# Patient Record
Sex: Male | Born: 1970 | Race: White | Hispanic: No | Marital: Single | Smoking: Current every day smoker
Health system: Southern US, Community
[De-identification: ages and names within clinical notes are randomized; demographics above are authoritative.]

## PROBLEM LIST (undated history)

## (undated) DIAGNOSIS — I10 Essential (primary) hypertension: Secondary | ICD-10-CM

## (undated) DIAGNOSIS — J45909 Unspecified asthma, uncomplicated: Secondary | ICD-10-CM

## (undated) HISTORY — PX: CHOLECYSTECTOMY: SHX55

---

## 2013-02-06 ENCOUNTER — Encounter (HOSPITAL_BASED_OUTPATIENT_CLINIC_OR_DEPARTMENT_OTHER): Payer: Self-pay | Admitting: Emergency Medicine

## 2013-02-06 ENCOUNTER — Emergency Department (HOSPITAL_BASED_OUTPATIENT_CLINIC_OR_DEPARTMENT_OTHER)
Admission: EM | Admit: 2013-02-06 | Discharge: 2013-02-06 | Disposition: A | Discharge status: Home / Self Care | Payer: Self-pay | Attending: Emergency Medicine | Admitting: Emergency Medicine

## 2013-02-06 ENCOUNTER — Emergency Department (HOSPITAL_BASED_OUTPATIENT_CLINIC_OR_DEPARTMENT_OTHER): Payer: Self-pay

## 2013-02-06 DIAGNOSIS — R630 Anorexia: Secondary | ICD-10-CM | POA: Insufficient documentation

## 2013-02-06 DIAGNOSIS — R109 Unspecified abdominal pain: Secondary | ICD-10-CM | POA: Insufficient documentation

## 2013-02-06 DIAGNOSIS — R590 Localized enlarged lymph nodes: Secondary | ICD-10-CM

## 2013-02-06 DIAGNOSIS — R634 Abnormal weight loss: Secondary | ICD-10-CM | POA: Insufficient documentation

## 2013-02-06 DIAGNOSIS — R599 Enlarged lymph nodes, unspecified: Secondary | ICD-10-CM | POA: Insufficient documentation

## 2013-02-06 DIAGNOSIS — R6883 Chills (without fever): Secondary | ICD-10-CM | POA: Insufficient documentation

## 2013-02-06 DIAGNOSIS — R1031 Right lower quadrant pain: Secondary | ICD-10-CM

## 2013-02-06 HISTORY — DX: Unspecified asthma, uncomplicated: J45.909

## 2013-02-06 HISTORY — DX: Essential (primary) hypertension: I10

## 2013-02-06 LAB — URINALYSIS, ROUTINE W REFLEX MICROSCOPIC
Nitrite: NEGATIVE
Specific Gravity, Urine: 1.018 (ref 1.005–1.030)
Urobilinogen, UA: 1 mg/dL (ref 0.0–1.0)
pH: 6.5 (ref 5.0–8.0)

## 2013-02-06 LAB — URINE MICROSCOPIC-ADD ON

## 2013-02-06 LAB — COMPREHENSIVE METABOLIC PANEL
ALT: 24 U/L (ref 0–53)
AST: 13 U/L (ref 0–37)
Albumin: 4.1 g/dL (ref 3.5–5.2)
Calcium: 9.6 mg/dL (ref 8.4–10.5)
Chloride: 105 mEq/L (ref 96–112)
Creatinine, Ser: 1 mg/dL (ref 0.50–1.35)
Potassium: 4.4 mEq/L (ref 3.5–5.1)
Sodium: 139 mEq/L (ref 135–145)
Total Bilirubin: 0.5 mg/dL (ref 0.3–1.2)

## 2013-02-06 LAB — CBC WITH DIFFERENTIAL/PLATELET
Basophils Absolute: 0 10*3/uL (ref 0.0–0.1)
HCT: 44.2 % (ref 39.0–52.0)
Hemoglobin: 15.5 g/dL (ref 13.0–17.0)
Lymphocytes Relative: 16 % (ref 12–46)
Lymphs Abs: 2 10*3/uL (ref 0.7–4.0)
MCH: 29.5 pg (ref 26.0–34.0)
MCHC: 35.1 g/dL (ref 30.0–36.0)
MCV: 84 fL (ref 78.0–100.0)
Neutro Abs: 8.8 10*3/uL — ABNORMAL HIGH (ref 1.7–7.7)
Platelets: 255 10*3/uL (ref 150–400)
RBC: 5.26 MIL/uL (ref 4.22–5.81)

## 2013-02-06 LAB — PROTIME-INR: INR: 1.08 (ref 0.00–1.49)

## 2013-02-06 LAB — APTT: aPTT: 32 seconds (ref 24–37)

## 2013-02-06 MED ORDER — ONDANSETRON HCL 4 MG/2ML IJ SOLN
4.0000 mg | Freq: Once | INTRAMUSCULAR | Status: AC
  Administered 2013-02-06: 4 mg via INTRAVENOUS
  Filled 2013-02-06: qty 2

## 2013-02-06 MED ORDER — HYDROMORPHONE HCL PF 1 MG/ML IJ SOLN
1.0000 mg | Freq: Once | INTRAMUSCULAR | Status: AC
  Administered 2013-02-06: 1 mg via INTRAVENOUS
  Filled 2013-02-06: qty 1

## 2013-02-06 MED ORDER — IOHEXOL 300 MG/ML  SOLN
50.0000 mL | Freq: Once | INTRAMUSCULAR | Status: AC | PRN
  Administered 2013-02-06: 50 mL via ORAL

## 2013-02-06 MED ORDER — SODIUM CHLORIDE 0.9 % IV BOLUS (SEPSIS)
1000.0000 mL | Freq: Once | INTRAVENOUS | Status: AC
  Administered 2013-02-06: 1000 mL via INTRAVENOUS

## 2013-02-06 MED ORDER — CEPHALEXIN 500 MG PO CAPS: 500.0000 mg | ORAL_CAPSULE | Freq: Four times a day (QID) | ORAL | Status: DC

## 2013-02-06 MED ORDER — HYDROCODONE-ACETAMINOPHEN 5-325 MG PO TABS: 2.0000 | ORAL_TABLET | ORAL | Status: DC | PRN

## 2013-02-06 MED ORDER — IOHEXOL 300 MG/ML  SOLN
100.0000 mL | Freq: Once | INTRAMUSCULAR | Status: AC | PRN
  Administered 2013-02-06: 100 mL via INTRAVENOUS

## 2013-02-06 NOTE — ED Provider Notes (Signed)
Patient is a 42 year old otherwise healthy male presents with complaints of swelling and tenderness in the right groin. He was seen initially by Dr. Bernette Mayers and had laboratory studies performed. This revealed an unremarkable urinalysis and white count was mildly elevated at 12.3. For that reason a CT scan was obtained and care was signed out to me at shift change. Results of the CT scan reveal an enlarged lymph node in the right inguinal region, however there is no evidence for intra-abdominal pathology, specifically there is no evidence for appendicitis. He seems to be feeling better and when reexamined his abdomen is benign. I explained the results of the studies to him and we'll treat the enlarged lymph node with Keflex and pain medication. He was advised to observe this for the next 2-3 weeks and if not improving or if it worsens this needs to be rechecked. He will be discharged to home.  Lucas Lyons, MD 02/06/13 [phone]

## 2013-02-06 NOTE — ED Notes (Signed)
R flank pain since around 3 am yesterday, intermittent, hurts more when ambulating or moving

## 2013-02-06 NOTE — ED Provider Notes (Signed)
CSN: [id number]     Arrival date & time 02/06/13  1245 History   First MD Initiated Contact with Patient 02/06/13 1307     Chief Complaint  Patient presents with  . Flank Pain   (Consider location/radiation/quality/duration/timing/severity/associated sxs/prior Treatment) Patient is a 42 y.o. male presenting with flank pain.  Flank Pain   Pt with no significant PMH reports swollen tender knot in R groin for the last week or so, now with worsening pain radiating into R flank, worse with movement, associated with urinary hesitancy, anorexia, weight loss and chills. He denies any testicular pain or swelling, no penile discharge.   No past medical history on file. No past surgical history on file. No family history on file. History  Substance Use Topics  . Smoking status: Not on file  . Smokeless tobacco: Not on file  . Alcohol Use: Not on file    Review of Systems  Genitourinary: Positive for flank pain.   All other systems reviewed and are negative except as noted in HPI.   Allergies  Review of patient's allergies indicates not on file.  Home Medications  No current outpatient prescriptions on file. BP 142/96  Pulse 107  Temp(Src) 98.2 F (36.8 C) (Oral)  Resp 18  Ht 5\' 9"  (1.753 m)  Wt 155 lb (70.308 kg)  BMI 22.88 kg/m2  SpO2 98% Physical Exam  Nursing note and vitals reviewed. Constitutional: He is oriented to person, place, and time. He appears well-developed and well-nourished.  HENT:  Head: Normocephalic and atraumatic.  Eyes: EOM are normal. Pupils are equal, round, and reactive to light.  Neck: Normal range of motion. Neck supple.  Cardiovascular: Normal rate, normal heart sounds and intact distal pulses.   Pulmonary/Chest: Effort normal and breath sounds normal.  Abdominal: Bowel sounds are normal. He exhibits no distension. There is no tenderness.  Genitourinary: Testes normal and penis normal.  Musculoskeletal: Normal range of motion. He exhibits  tenderness (R lower back and R flank tender). He exhibits no edema.  Lymphadenopathy:       Right: Inguinal (large tender lymph node in R groin) adenopathy present.       Left: Inguinal adenopathy present.  Neurological: He is alert and oriented to person, place, and time. He has normal strength. No cranial nerve deficit or sensory deficit.  Skin: Skin is warm and dry. No rash noted.  Psychiatric: He has a normal mood and affect.    ED Course  Procedures (including critical care time) Labs Review Labs Reviewed  CBC WITH DIFFERENTIAL  URINALYSIS, ROUTINE W REFLEX MICROSCOPIC  COMPREHENSIVE METABOLIC PANEL  PROTIME-INR  APTT   Imaging Review No results found.  EKG Interpretation   None       MDM  No diagnosis found. Care signed out to Dr. Judd Lien at shift change pending CT.    Charles B. Bernette Mayers, MD 02/06/13 2036

## 2013-07-18 ENCOUNTER — Emergency Department (HOSPITAL_BASED_OUTPATIENT_CLINIC_OR_DEPARTMENT_OTHER)
Admission: EM | Admit: 2013-07-18 | Discharge: 2013-07-18 | Disposition: A | Discharge status: Home / Self Care | Payer: Self-pay | Attending: Emergency Medicine | Admitting: Emergency Medicine

## 2013-07-18 ENCOUNTER — Encounter (HOSPITAL_BASED_OUTPATIENT_CLINIC_OR_DEPARTMENT_OTHER): Payer: Self-pay | Admitting: Emergency Medicine

## 2013-07-18 DIAGNOSIS — J45909 Unspecified asthma, uncomplicated: Secondary | ICD-10-CM | POA: Insufficient documentation

## 2013-07-18 DIAGNOSIS — I10 Essential (primary) hypertension: Secondary | ICD-10-CM | POA: Insufficient documentation

## 2013-07-18 DIAGNOSIS — K0889 Other specified disorders of teeth and supporting structures: Secondary | ICD-10-CM

## 2013-07-18 DIAGNOSIS — K089 Disorder of teeth and supporting structures, unspecified: Secondary | ICD-10-CM | POA: Insufficient documentation

## 2013-07-18 DIAGNOSIS — F17200 Nicotine dependence, unspecified, uncomplicated: Secondary | ICD-10-CM | POA: Insufficient documentation

## 2013-07-18 MED ORDER — TRAMADOL HCL 50 MG PO TABS: 50.0000 mg | ORAL_TABLET | Freq: Four times a day (QID) | ORAL | Status: DC | PRN

## 2013-07-18 MED ORDER — TRAMADOL HCL 50 MG PO TABS
50.0000 mg | ORAL_TABLET | Freq: Once | ORAL | Status: AC
  Administered 2013-07-18: 50 mg via ORAL
  Filled 2013-07-18: qty 1

## 2013-07-18 MED ORDER — PENICILLIN V POTASSIUM 500 MG PO TABS: 500.0000 mg | ORAL_TABLET | Freq: Four times a day (QID) | ORAL | Status: AC

## 2013-07-18 MED ORDER — IBUPROFEN 800 MG PO TABS
800.0000 mg | ORAL_TABLET | Freq: Once | ORAL | Status: AC
  Administered 2013-07-18: 800 mg via ORAL
  Filled 2013-07-18: qty 1

## 2013-07-18 NOTE — ED Notes (Signed)
Patient reports that he has right lower gum and jaw pain pain since Thursday, thinks food stuck in same and had swelling but took family members amoxicillin with some reduction in swelling. Also complains of left ear drainage and pain

## 2013-07-18 NOTE — ED Provider Notes (Signed)
CSN: [id number]     Arrival date & time 07/18/13  1345 History   First MD Initiated Contact with Patient 07/18/13 1442     Chief Complaint  Patient presents with  . Dental Pain     (Consider location/radiation/quality/duration/timing/severity/associated sxs/prior Treatment) HPI Comments: 43 yo male with poor dentition hx, smoker presents with right lower dental pain since Thursday after chewing. Pain with palpation, constant.  No fevers. No swelling of face. Pt cannot afford dentist right now.  Tried left over amox, mild improvement.  Patient is a 43 y.o. male presenting with tooth pain. The history is provided by the patient.  Dental Pain Associated symptoms: no drooling and no fever     Past Medical History  Diagnosis Date  . Hypertension   . Asthma    Past Surgical History  Procedure Laterality Date  . Cholecystectomy     No family history on file. History  Substance Use Topics  . Smoking status: Current Every Day Smoker -- 1.00 packs/day    Types: Cigarettes  . Smokeless tobacco: Not on file  . Alcohol Use: Yes     Comment: special occasions    Review of Systems  Constitutional: Negative for fever, chills and appetite change.  HENT: Positive for dental problem. Negative for drooling.       Allergies  Codeine  Home Medications   Current Outpatient Rx  Name  Route  Sig  Dispense  Refill  . penicillin v potassium (VEETID) 500 MG tablet   Oral   Take 1 tablet (500 mg total) by mouth 4 (four) times daily.   28 tablet   0   . traMADol (ULTRAM) 50 MG tablet   Oral   Take 1 tablet (50 mg total) by mouth every 6 (six) hours as needed.   10 tablet   0    BP 137/93  Pulse 76  Temp(Src) 97.7 F (36.5 C) (Oral)  Resp 16  Ht 5\' 9"  (1.753 m)  Wt 160 lb (72.576 kg)  BMI 23.62 kg/m2  SpO2 98% Physical Exam  Nursing note and vitals reviewed. Constitutional: He appears well-developed and well-nourished. No distress.  HENT:  Head: Normocephalic and  atraumatic.  Mild right gingival tenderness right lower, poor dentition, multiple missing teeth No trismus, uvular deviation, unilateral posterior pharyngeal edema or submandibular swelling.   Neck: Normal range of motion. Neck supple.  Cardiovascular: Normal rate.   Pulmonary/Chest: Effort normal.    ED Course  Procedures (including critical care time) Labs Review Labs Reviewed - No data to display Imaging Review No results found.   EKG Interpretation None      MDM   Final diagnoses:  Pain, dental    Dental pain. Smoking cessation and outpatient follow up was discussed with the patient for 3 to 5  Minutes.  Pain meds in ED. Results and differential diagnosis were discussed with the patient. Close follow up outpatient was discussed, patient comfortable with the plan.   Filed Vitals:   07/18/13 1351  BP: 137/93  Pulse: 76  Temp: 97.7 F (36.5 C)  TempSrc: Oral  Resp: 16  Height: 5\' 9"  (1.753 m)  Weight: 160 lb (72.576 kg)  SpO2: 98%     Enid SkeensJoshua M Jaycie Kregel, MD 07/18/13 [phone]

## 2013-07-18 NOTE — Discharge Instructions (Signed)
If you were given medicines take as directed.  If you are on coumadin or contraceptives realize their levels and effectiveness is altered by many different medicines.  If you have any reaction (rash, tongues swelling, other) to the medicines stop taking and see a physician.   Please follow up as directed and return to the ER or see a physician for new or worsening symptoms.  Thank you. Filed Vitals:   07/18/13 1351  BP: 137/93  Pulse: 76  Temp: 97.7 F (36.5 C)  TempSrc: Oral  Resp: 16  Height: 5\' 9"  (1.753 m)  Weight: 160 lb (72.576 kg)  SpO2: 98%    Emergency Department Resource Guide 1) Find a Doctor and Pay Out of Pocket Although you won't have to find out who is covered by your insurance plan, it is a good idea to ask around and get recommendations. You will then need to call the office and see if the doctor you have chosen will accept you as a new patient and what types of options they offer for patients who are self-pay. Some doctors offer discounts or will set up payment plans for their patients who do not have insurance, but you will need to ask so you aren't surprised when you get to your appointment.  2) Contact Your Local Health Department Not all health departments have doctors that can see patients for sick visits, but many do, so it is worth a call to see if yours does. If you don't know where your local health department is, you can check in your phone book. The CDC also has a tool to help you locate your state's health department, and many state websites also have listings of all of their local health departments.  3) Find a Walk-in Clinic If your illness is not likely to be very severe or complicated, you may want to try a walk in clinic. These are popping up all over the country in pharmacies, drugstores, and shopping centers. They're usually staffed by nurse practitioners or physician assistants that have been trained to treat common illnesses and complaints. They're  usually fairly quick and inexpensive. However, if you have serious medical issues or chronic medical problems, these are probably not your best option.  No Primary Care Doctor: - Call Health Connect at  [phone] - they can help you locate a primary care doctor that  accepts your insurance, provides certain services, etc. - Physician Referral Service- [phone]  Chronic Pain Problems: Organization         Address  Phone   Notes  [facility]  [phone] Patients need to be referred by their primary care doctor.   Medication Assistance: Organization         Address  Phone   Notes  [facility] Medication [facility] [address] [place] [place], [place] [address] [phone] --Must be a resident of [facility] -- Must have NO insurance coverage whatsoever (no Medicaid/ Medicare, etc.) -- The pt. MUST have a primary care doctor that directs their care regularly and follows them in the community   [facility]  [phone]   [employer]  [phone]    Agencies that provide inexpensive medical care: Organization         Address  Phone   Notes  [facility]  [phone]   [facility]    [phone]   [facility] [address] [place], [place] [address] (  336) [phone]   [facility] [place] [address], [place] [phone](336) [phone]   [facility]anned [facility]    [phone]   [facility]    [phone](336) [phone]   [facility]  [address], [place] Phone:  [phone], Fax:  [phone] Hours of Operation:  9 am - 6 pm, M-F.  Also accepts Medicaid/Medicare and self-pay.  [facility]  [address], [place] Phone: [phone](336) [phone], Fax: [phone]. Hours of Operation:  8:30 am - 5:30 pm, M-F.  Also accepts Medicaid and self-pay.  [facility] [place] [address], [place] Phone:  [phone]   [facility] [address], [place], [place] [phone], Ext. [phone] Mondays & Thursdays: 7-9 AM.  First 15 patients are seen on a first come, first serve basis.    Medicaid-accepting [facility] Providers:  Organization         Address  Phone   Notes  [facility] [address], [place] [phone](336) [phone] Also accepts self-pay patients.  [facility] [address], [place]  [phone]   [facility] [address], [place] [phone]   [facility] [address] [place] [address], [place] [phone](336) [phone]   Renaye RakersVeita Bland [address], [place]   [phone] Only accepts [place] Access [place] patients after they have their name applied to their card.   Self-Pay (no insurance) in [facility]:  Organization         Address  Phone   Notes  Sickle Cell Patients, [facility] [address], [place] [phone]) [phone]   [facility] [address], [place] [phone]) [phone]   [facility]  [address], [place] [phone](336) [phone]   [facility]/Dr. Osei-Bonsu  [address] [place] [address], [place] or [phone], [place] [phone] Phone number for both [place] and [place] locations is the same.  [facility] [address], [place] [phone]   [facility] [place] [address] [place] [address], [place] or [address] [phone](336) [phone]   [facility] [address], [place] [phone]) [phone], phone; [phone](336) [phone], fax Sees patients 1st and 3rd Saturday of every month.  Must not qualify for public or private insurance (i.e. Medicaid, Medicare, [place] Health Choice, Veterans' Benefits)  Household income should be no more than 200% of the poverty level The clinic cannot treat  you if you are pregnant or think you are pregnant  Sexually transmitted diseases are not treated at the clinic.    Dental Care: Organization         Address  Phone  Notes  [facility] [address], [place] [phone](336) [phone] Accepts children up to age 44 who are enrolled in [place] or [place] Health Choice; pregnant women with a Medicaid card; and children who have applied for Medicaid or [place] Health Choice, but were declined, whose parents can pay a reduced fee at time of service.  [facility] of [facility] [place]  [address], [place] [phone](336) [phone] Accepts children up to age 44 who are enrolled in [place] or [place] Health Choice; pregnant women with  a Medicaid card; and children who have applied for Medicaid or [place] Health Choice, but were declined, whose parents can pay a reduced fee at time of service.  [facility]  [address] [place], [place] [phone] Patients are seen by appointment only. Walk-ins are not accepted. [facility] will see patients 23 years of age and older. Monday - Tuesday (8am-5pm) Most Wednesdays (8:30-5pm) $30 per visit, cash only  [facility] Access [facility]  [address], [facility] [phone] Patients are seen by appointment only. Walk-ins are not accepted. [facility] will see patients 68 years of age and older. One Wednesday Evening (Monthly: Volunteer Based).  $30 per visit, cash only  [employer]  [phone] for adults; Children under age 36, call Graduate Pediatric Dentistry at [phone]. Children aged 39-14, please call [phone] to request a pediatric application.  Dental services are provided in all areas of dental care including fillings, crowns and bridges, complete and partial dentures, implants, gum treatment, root canals, and extractions. Preventive care is also provided. Treatment  is provided to both adults and children. Patients are selected via a lottery and there is often a waiting list.   [facility] [address], [place]  [phone] [email or web address]   [facility] [address] [place], [place] [phone], Ext. [phone] Second and Fourth Thursday of each month, opens at 6:30 AM; Clinic ends at 9 AM.  Patients are seen on a first-come first-served basis, and a limited number are seen during each clinic.   [facility]  [address] [place], [place] [phone]   Eligibility Requirements You must have lived in [place], [place], or [place] counties for at least the last three months.   You cannot be eligible for state or federal sponsored [place], including [employer], [place], or [employer].   You generally cannot be eligible for healthcare insurance through your employer.    How to apply: Eligibility screenings are held every Tuesday and Wednesday afternoon from 1:00 pm until 4:00 pm. You do not need an appointment for the interview!  [facility] [address] [place], [place] [phone]   [facility] Health Department  [phone]   [facility] Health Department  [phone]   [facility] Health Department  [phone]    Behavioral Health Resources in the Community: Intensive Outpatient Programs Organization         Address  Phone  Notes  [facility] [address] [place], [place] [phone]   [facility] [address], [place], [place] [phone]   [facility] [address], [place], [place]  [phone]   [facility] [address],  [place], [place] [phone] or [phone]   Substance Abuse Resources Organization         Address  Phone  Notes  Al[facility] Drug Services  [phone]   [facility]  [phone]   The  [place]  [phone]   Floydene Flock  [phone]   Residential & Outpatient Substance Abuse Program  [phone]   Psychological Services Organization         Address  Phone  Notes  [facility] Behavioral Health  [phone]   [facility]  [phone]   [facility] [address] [place] [phone] or [phone]    Mobile Crisis Teams Organization  Address  Phone  Notes  [facility], Mobile Crisis Care Unit  [phone]   [facility]  [address] [place], [place]   [facility] [address] [place] [phone]    Self-Help/Support Groups Organization         Address  Phone             Notes  [place] [facility] [place] - variety of support groups  [place] Call for more information  Narcotics Anonymous (NA), [facility] [address], [employer] [place]  2 meetings at this location   Special educational needs teacher         Address  Phone  Notes  [facility] [place],    [place]  [phone]   [facility]  [address] [place] 329924, [place], [place]   [place], [place] [phone] Admissions: 8am-3pm M-F  Incentives Substance [place] [address], [place] [phone]   [facility] [address] [place], [place], [place]   [facility] [address],  [place], [place]   [facility] - Intensive Outpatient [address], [place], [place]   [facility] [place].,  [place], [place] [phone] or [phone]   [facility] (RT[facility]) [address], [place], [place] Accepts Medicaid  [facility] [place] [address]  [place] [phone] Substance Abuse/Addiction Treatment     [facility] Organization         Address  Phone  Notes  [facility]  [phone]   Domenic Schwab, PhD [address] [place], [place]   [phone] or [phone]   [place], [place] [phone]   [facility] [address] [place], [place] [phone] Insurance/Medicaid/sponsorship through [facility] and Families [address], [address]                                    [place], [place] [phone] [place] [address], [place] [phone]    Dr. Adele Schilder  [phone]   [facility] Dept. 1) [address], [place] 2) [place] 3)  [place] [address], [place] [phone]  [phone]   [place] [phone] or [phone] (After Hours)

## 2014-09-15 IMAGING — CT CT ABD-PELV W/ CM
2 of 5 series · 16 of 46 positions shown, 18 images · IV contrast (APPLIED)
Comparison: No priors.

CLINICAL DATA: Right-sided flank and right lower quadrant abdominal
pain. Right inguinal lymphadenopathy. Weight loss.

EXAM:
CT ABDOMEN AND PELVIS WITH CONTRAST
TECHNIQUE: Multidetector CT imaging of the abdomen and pelvis was performed
using the standard protocol following bolus administration of
intravenous contrast.
CONTRAST:  50mL OMNIPAQUE IOHEXOL 300 MG/ML SOLN, 100mL OMNIPAQUE
IOHEXOL 300 MG/ML SOLN

[Series 2: abd/pelvis 5.0 b31f · axial · 0.70mm/px · z∈[+856,+1251]mm · 13 of 89 slices shown, 15 images]
[im 5/89  soft-tissue]
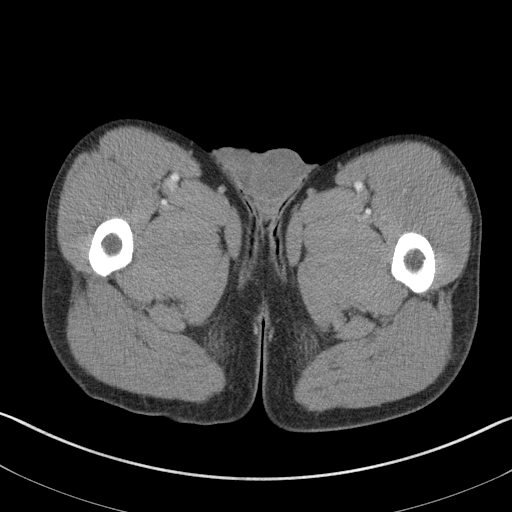
[im 5/89  bone]
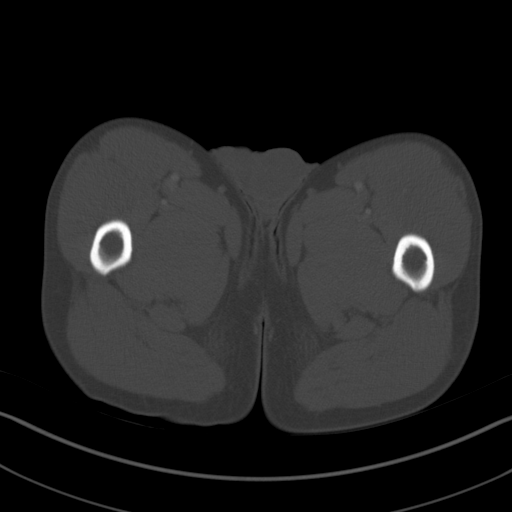
[im 14/89  soft-tissue]
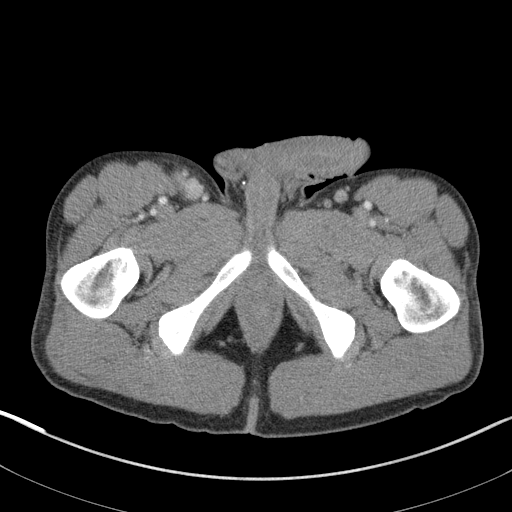
[im 18/89  soft-tissue]
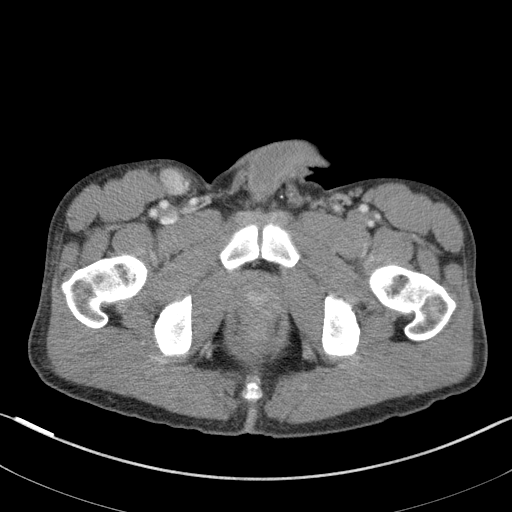
[im 27/89  soft-tissue]
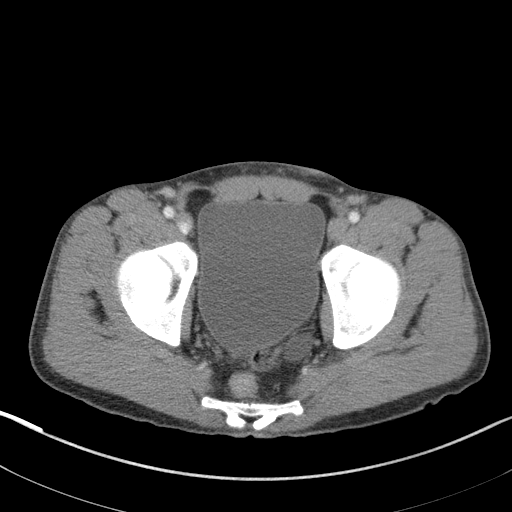
[im 31/89  soft-tissue]
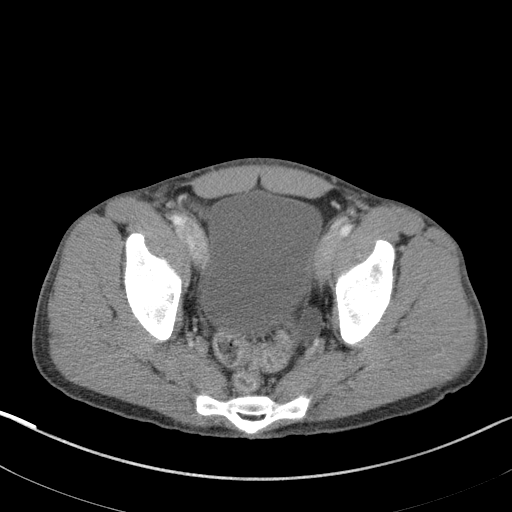
[im 40/89  soft-tissue]
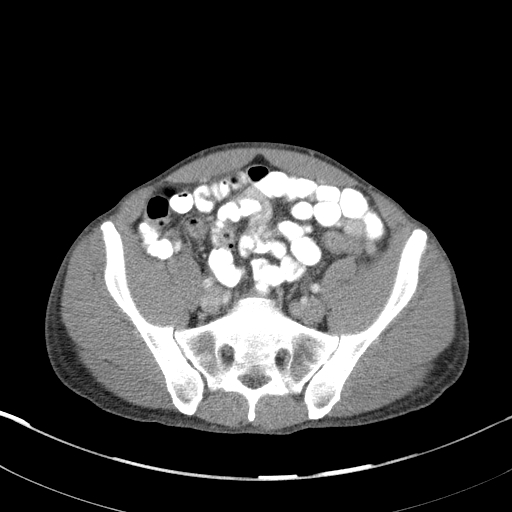
[im 45/89  soft-tissue]
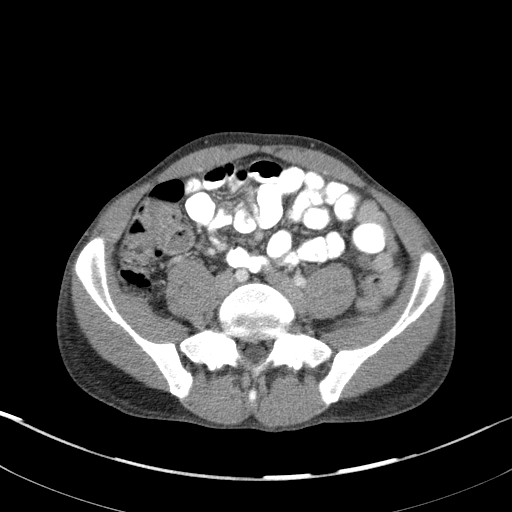
[im 49/89  soft-tissue]
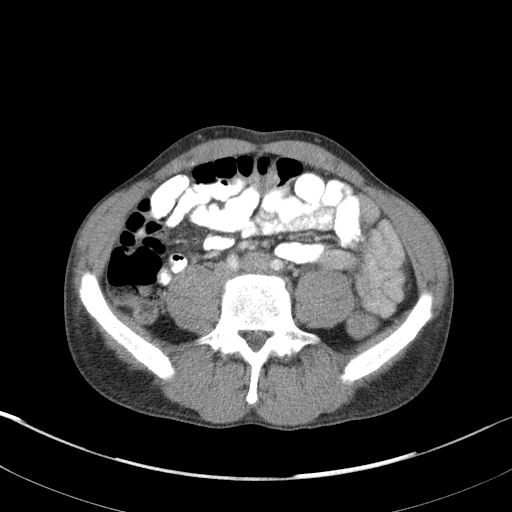
[im 58/89  soft-tissue]
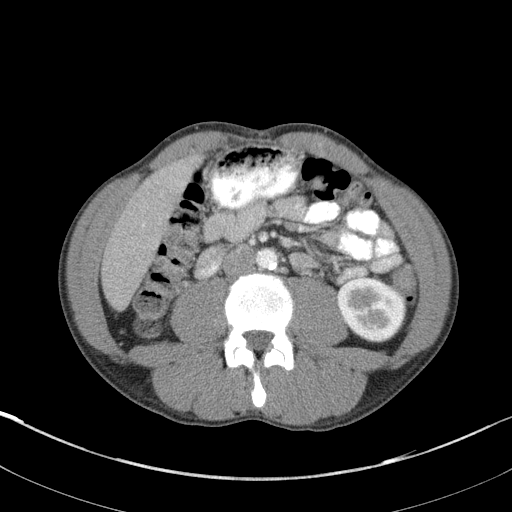
[im 58/89  bone]
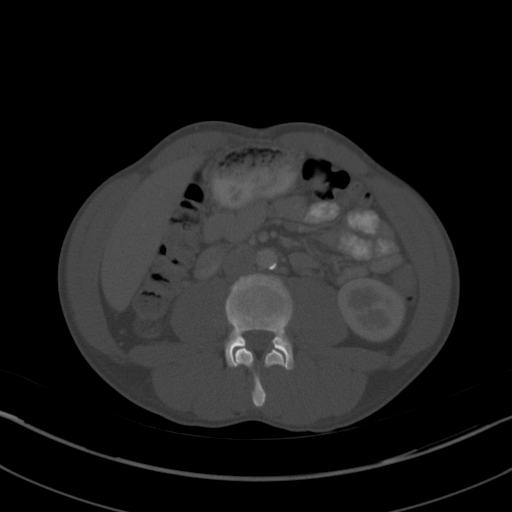
[im 62/89  soft-tissue]
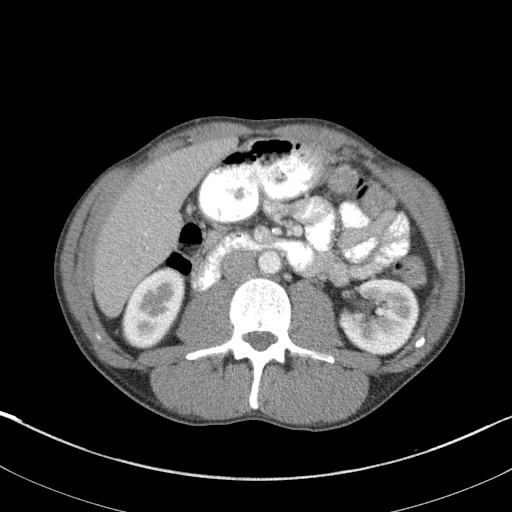
[im 71/89  soft-tissue]
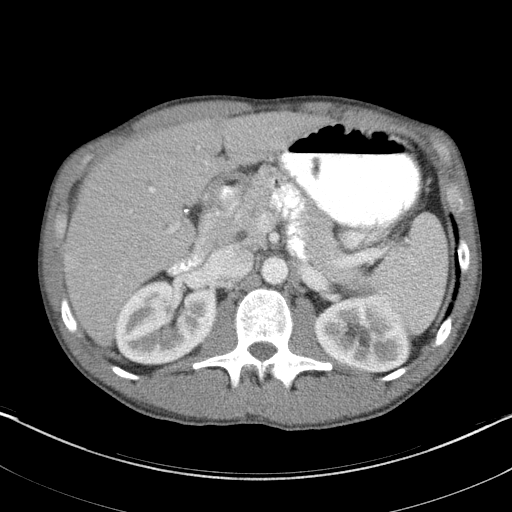
[im 75/89  soft-tissue]
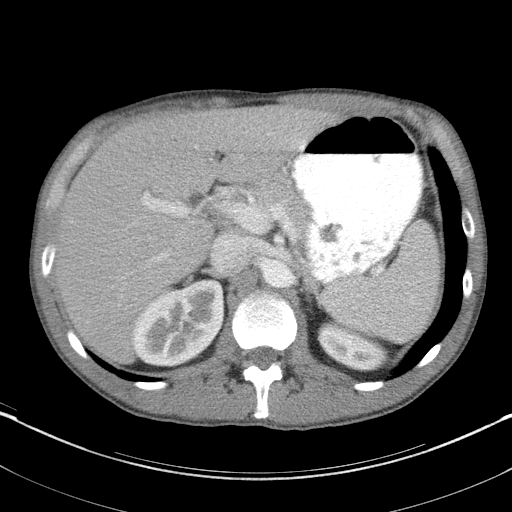
[im 84/89  soft-tissue]
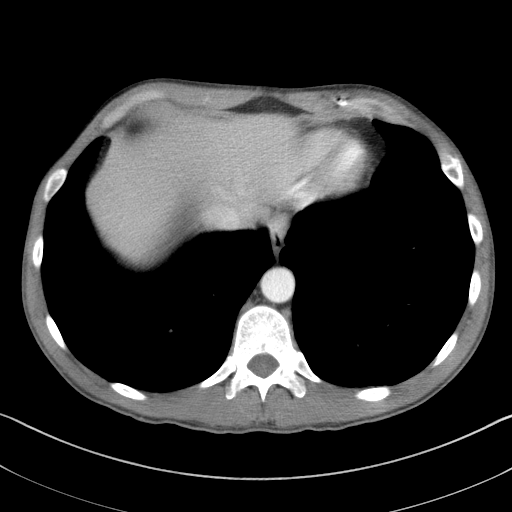

[Series 5: abd/pelvis 3.0 coronal · coronal · 0.91mm/px · 3 of 78 slices shown]
[im 26/78  soft-tissue]
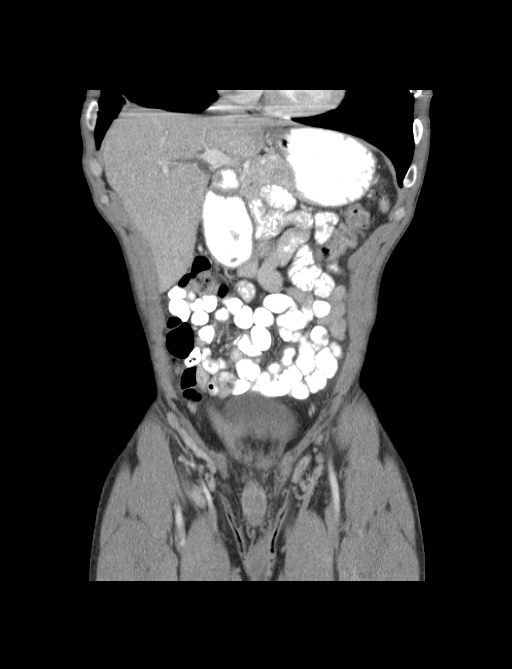
[im 35/78  soft-tissue]
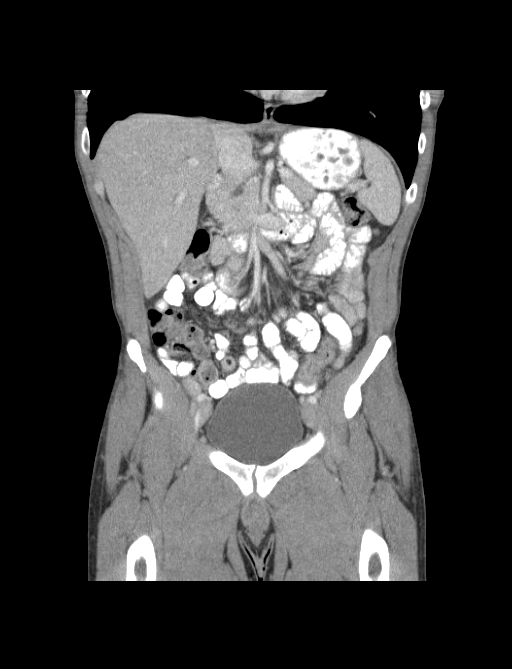
[im 43/78  soft-tissue]
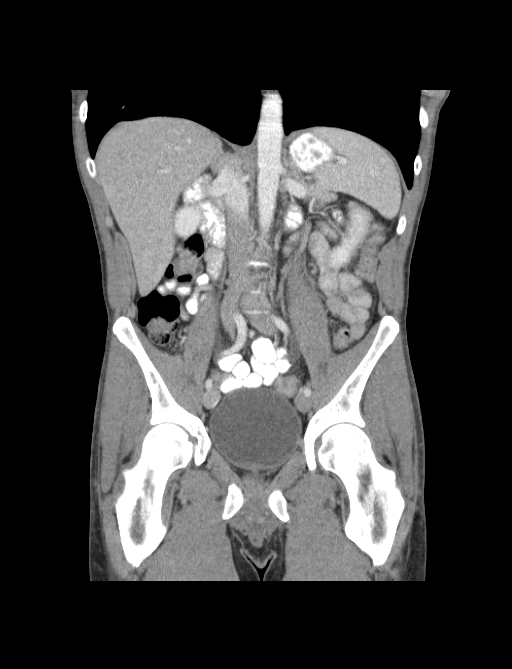

[16 of 46 positions shown; findings below may reference images not displayed]

FINDINGS: Lung Bases: Small amount of scarring in the lateral segment of the
right middle lobe.

Abdomen/Pelvis: Status post cholecystectomy. The appearance of the
liver, pancreas, spleen, bilateral adrenal glands and bilateral
kidneys is unremarkable. No significant volume of ascites. No
pneumoperitoneum. No pathologic distention of small bowel. No
definite lymphadenopathy identified within the abdomen or pelvis.
Prostate gland and urinary bladder are unremarkable in appearance.

Musculoskeletal: A few enlarged enhancing right inguinal lymph
nodes, largest which measures 1.7 cm in diameter on image 70 of
series 2. No left inguinal lymphadenopathy is noted. There are no
aggressive appearing lytic or blastic lesions noted in the
visualized portions of the skeleton.
IMPRESSION: 1. Right inguinal lymphadenopathy, as above. This is an isolated
finding which is of uncertain etiology and significance. No
additional suspicious findings are noted within the abdomen or
pelvis on today's CT examination.

## 2023-10-08 ENCOUNTER — Emergency Department (HOSPITAL_BASED_OUTPATIENT_CLINIC_OR_DEPARTMENT_OTHER): Payer: MEDICAID

## 2023-10-08 ENCOUNTER — Encounter (HOSPITAL_BASED_OUTPATIENT_CLINIC_OR_DEPARTMENT_OTHER): Payer: Self-pay

## 2023-10-08 ENCOUNTER — Observation Stay (HOSPITAL_BASED_OUTPATIENT_CLINIC_OR_DEPARTMENT_OTHER)
Admission: EM | Admit: 2023-10-08 | Discharge: 2023-10-09 | Disposition: A | Discharge status: Home / Self Care | Payer: MEDICAID | Attending: Student | Admitting: Student

## 2023-10-08 DIAGNOSIS — R7989 Other specified abnormal findings of blood chemistry: Secondary | ICD-10-CM | POA: Diagnosis not present

## 2023-10-08 DIAGNOSIS — R911 Solitary pulmonary nodule: Secondary | ICD-10-CM | POA: Diagnosis not present

## 2023-10-08 DIAGNOSIS — J449 Chronic obstructive pulmonary disease, unspecified: Secondary | ICD-10-CM | POA: Insufficient documentation

## 2023-10-08 DIAGNOSIS — I82462 Acute embolism and thrombosis of left calf muscular vein: Secondary | ICD-10-CM | POA: Diagnosis present

## 2023-10-08 DIAGNOSIS — I82402 Acute embolism and thrombosis of unspecified deep veins of left lower extremity: Principal | ICD-10-CM | POA: Diagnosis present

## 2023-10-08 DIAGNOSIS — I10 Essential (primary) hypertension: Secondary | ICD-10-CM | POA: Diagnosis not present

## 2023-10-08 DIAGNOSIS — D649 Anemia, unspecified: Secondary | ICD-10-CM | POA: Insufficient documentation

## 2023-10-08 DIAGNOSIS — I82492 Acute embolism and thrombosis of other specified deep vein of left lower extremity: Principal | ICD-10-CM | POA: Insufficient documentation

## 2023-10-08 DIAGNOSIS — Z8709 Personal history of other diseases of the respiratory system: Secondary | ICD-10-CM

## 2023-10-08 DIAGNOSIS — R748 Abnormal levels of other serum enzymes: Secondary | ICD-10-CM | POA: Insufficient documentation

## 2023-10-08 DIAGNOSIS — R2242 Localized swelling, mass and lump, left lower limb: Secondary | ICD-10-CM | POA: Diagnosis present

## 2023-10-08 DIAGNOSIS — Z8679 Personal history of other diseases of the circulatory system: Secondary | ICD-10-CM

## 2023-10-08 LAB — CBC WITH DIFFERENTIAL/PLATELET
Abs Immature Granulocytes: 0.04 10*3/uL (ref 0.00–0.07)
Basophils Absolute: 0.1 10*3/uL (ref 0.0–0.1)
Basophils Relative: 1 %
Eosinophils Absolute: 0.3 10*3/uL (ref 0.0–0.5)
Eosinophils Relative: 3 %
HCT: 35 % — ABNORMAL LOW (ref 39.0–52.0)
Hemoglobin: 11.2 g/dL — ABNORMAL LOW (ref 13.0–17.0)
Immature Granulocytes: 0 %
Lymphocytes Relative: 23 %
Lymphs Abs: 2.4 10*3/uL (ref 0.7–4.0)
MCH: 28 pg (ref 26.0–34.0)
MCHC: 32 g/dL (ref 30.0–36.0)
MCV: 87.5 fL (ref 80.0–100.0)
Monocytes Absolute: 0.9 10*3/uL (ref 0.1–1.0)
Monocytes Relative: 9 %
Neutro Abs: 7 10*3/uL (ref 1.7–7.7)
Neutrophils Relative %: 64 %
Platelets: 357 10*3/uL (ref 150–400)
RBC: 4 MIL/uL — ABNORMAL LOW (ref 4.22–5.81)
RDW: 14.7 % (ref 11.5–15.5)
WBC: 10.8 10*3/uL — ABNORMAL HIGH (ref 4.0–10.5)
nRBC: 0 % (ref 0.0–0.2)

## 2023-10-08 LAB — COMPREHENSIVE METABOLIC PANEL WITH GFR
ALT: 72 U/L — ABNORMAL HIGH (ref 0–44)
AST: 40 U/L (ref 15–41)
Albumin: 3.8 g/dL (ref 3.5–5.0)
Alkaline Phosphatase: 80 U/L (ref 38–126)
Anion gap: 5 (ref 5–15)
BUN: 18 mg/dL (ref 6–20)
CO2: 34 mmol/L — ABNORMAL HIGH (ref 22–32)
Calcium: 8.9 mg/dL (ref 8.9–10.3)
Chloride: 102 mmol/L (ref 98–111)
Creatinine, Ser: 1.06 mg/dL (ref 0.61–1.24)
GFR, Estimated: >60 mL/min (ref >60)
Glucose, Bld: 110 mg/dL — ABNORMAL HIGH (ref 70–99)
Potassium: 3.9 mmol/L (ref 3.5–5.1)
Sodium: 141 mmol/L (ref 135–145)
Total Bilirubin: 0.4 mg/dL (ref 0.0–1.2)
Total Protein: 6.1 g/dL — ABNORMAL LOW (ref 6.5–8.1)

## 2023-10-08 LAB — TROPONIN T, HIGH SENSITIVITY
Troponin T High Sensitivity: 109 ng/L (ref <19)
Troponin T High Sensitivity: 108 ng/L (ref <19)

## 2023-10-08 MED ORDER — HYDROMORPHONE HCL 1 MG/ML IJ SOLN
0.5000 mg | INTRAMUSCULAR | Status: DC | PRN
  Administered 2023-10-09: 0.5 mg via INTRAVENOUS
  Filled 2023-10-08: qty 1

## 2023-10-08 MED ORDER — ACETAMINOPHEN 325 MG PO TABS: 650.0000 mg | ORAL_TABLET | Freq: Four times a day (QID) | ORAL | Status: DC | PRN

## 2023-10-08 MED ORDER — APIXABAN 5 MG PO TABS: 5.0000 mg | ORAL_TABLET | Freq: Two times a day (BID) | ORAL | Status: DC

## 2023-10-08 MED ORDER — HYDRALAZINE HCL 20 MG/ML IJ SOLN
10.0000 mg | INTRAMUSCULAR | Status: DC | PRN
  Administered 2023-10-08: 10 mg via INTRAVENOUS
  Filled 2023-10-08: qty 1

## 2023-10-08 MED ORDER — MELATONIN 3 MG PO TABS: 3.0000 mg | ORAL_TABLET | Freq: Every evening | ORAL | Status: DC | PRN

## 2023-10-08 MED ORDER — ACETAMINOPHEN 650 MG RE SUPP: 650.0000 mg | Freq: Four times a day (QID) | RECTAL | Status: DC | PRN

## 2023-10-08 MED ORDER — APIXABAN 5 MG PO TABS
10.0000 mg | ORAL_TABLET | Freq: Two times a day (BID) | ORAL | Status: DC
  Administered 2023-10-08 – 2023-10-09 (×3): 10 mg via ORAL
  Filled 2023-10-08: qty 4
  Filled 2023-10-08: qty 2
  Filled 2023-10-08: qty 4

## 2023-10-08 MED ORDER — FENTANYL CITRATE PF 50 MCG/ML IJ SOSY
50.0000 ug | PREFILLED_SYRINGE | Freq: Once | INTRAMUSCULAR | Status: AC
  Administered 2023-10-08: 50 ug via INTRAVENOUS
  Filled 2023-10-08: qty 1

## 2023-10-08 MED ORDER — KETOROLAC TROMETHAMINE 15 MG/ML IJ SOLN
15.0000 mg | Freq: Once | INTRAMUSCULAR | Status: AC
  Administered 2023-10-08: 15 mg via INTRAVENOUS
  Filled 2023-10-08: qty 1

## 2023-10-08 MED ORDER — IOHEXOL 350 MG/ML SOLN
100.0000 mL | Freq: Once | INTRAVENOUS | Status: AC | PRN
  Administered 2023-10-08: 75 mL via INTRAVENOUS

## 2023-10-08 MED ORDER — NALOXONE HCL 0.4 MG/ML IJ SOLN: 0.4000 mg | INTRAMUSCULAR | Status: DC | PRN

## 2023-10-08 MED ORDER — ONDANSETRON HCL 4 MG/2ML IJ SOLN: 4.0000 mg | Freq: Four times a day (QID) | INTRAMUSCULAR | Status: DC | PRN

## 2023-10-08 NOTE — ED Notes (Signed)
 Patient states he is suppose to be taking Clonidine for his b/p but that he isn't.

## 2023-10-08 NOTE — ED Notes (Signed)
 EDP approved pt to have crackers and ginger ale . CT scan still pending

## 2023-10-08 NOTE — ED Notes (Signed)
 Per Patient request, RN called Ammon Bales (his sister) to give her an update.  [phone].
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
## 2023-10-08 NOTE — Progress Notes (Signed)
 Facility requesting transfer: [facility] Requesting Provider: Mylinda Asa PA Reason for transfer: Elevated troponin  Facility course:  Lucas Conrad is a 53 y.o. male with PMH significant for HTN, asthma  Patient presented to ED with complaint of worsening left leg swelling.  Found to have DVT CTPA negative for PE but troponin elevated over 100 No prior cardiac history No co chest pain I suggested to start heparin drip but it seems patient already received Eliquis. EDP to discuss with pharmacy Will accept to cardiac telemetry bed at [facility]  Please note that [facility] will assume the care once patient arrives to the hospital. Prior to that, please reach out to the ED provider on site for any medical need.   Signed, Hoyt Macleod, MD [facility] 10/08/2023

## 2023-10-08 NOTE — ED Provider Notes (Signed)
 [place] [facility] Provider Note   CSN: [id number] Arrival date & time: 10/08/23  7829     History  Chief Complaint  Patient presents with   Leg Swelling    Lucas Conrad is a 53 y.o. male with history of hypertension, asthma, presents with concern for left leg pain ongoing since 10/02/2023.  States that on 6/5, he was crossing the street and then did not remember what happened next.  He awoke with EMS around him.  He states that nobody witnessed the event so it is unclear what happened.  He denied any chest pain or shortness of breath before this event or since this event.  States that since that date, pain and swelling in his left leg has been increasing.  Swelling goes from the foot all the way up to the left thigh.  Denies any numbness or tingling in the lower extremities.  States he took oxycodone about 4 hours prior to arrival without relief of symptoms.  HPI     Home Medications Prior to Admission medications   Medication Sig Start Date End Date Taking? Authorizing Provider  traMADol  (ULTRAM ) 50 MG tablet Take 1 tablet (50 mg total) by mouth every 6 (six) hours as needed. 07/18/13   Clay Cummins, MD      Allergies    Codeine    Review of Systems   Review of Systems  Musculoskeletal:        Left lower extremity pain    Physical Exam Updated Vital Signs BP (!) 147/105   Pulse 75   Temp 98 F (36.7 C) (Oral)   Resp 10   Wt 74.8 kg   SpO2 95%  Physical Exam Vitals and nursing note reviewed.  Constitutional:      General: He is not in acute distress.    Appearance: He is well-developed.  HENT:     Head: Normocephalic and atraumatic.     Comments: No lacerations, abrasions, contusions    Mouth/Throat:     Comments: Missing dentition throughout the mouth diffusely Eyes:     Extraocular Movements: Extraocular movements intact.     Conjunctiva/sclera: Conjunctivae normal.     Pupils: Pupils are equal, round, and reactive to  light.  Cardiovascular:     Rate and Rhythm: Normal rate and regular rhythm.     Heart sounds: No murmur heard.    Comments: 2+ pedal pulses bilaterally Pulmonary:     Effort: Pulmonary effort is normal. No respiratory distress.     Breath sounds: Normal breath sounds.  Abdominal:     Palpations: Abdomen is soft.     Tenderness: There is no abdominal tenderness.  Musculoskeletal:        General: No swelling.     Cervical back: Neck supple.     Comments: General Left lower extremity with diffuse nonpitting edema extending from the foot up to the distal left thigh.  No edema of the right lower extremity.  No abrasions, wounds, erythema, or ecchymoses of the lower extremities bilaterally No obvious deformity.   Lipoma of right upper back  Palpation Tender to palpation over the distal left calf and over left greater trochanter.  No bony tenderness to the distal femur, patella, tibia or fibula bilaterally.  No tenderness in the popliteal fossa bilaterally  Non-tender to palpation of the clavicles,humerus, radius and ulna, carpal bones, 1st-5th metacarpals and phalanges bilaterally  Non-tender over the cervical, thoracic, or lumbar spinous processes. Non-tender to palpation of the  paraspinal region of the back or chest wall diffusely  No tenderness of the pelvis diffusely  ROM Full ROM of shoulders bilaterally Full elbow, wrist, knee flexion and extension bilaterally Intact plantarflexion and dorsiflexion, hip flexion bilaterally (Reports difficulty with plantarflexion and dorsiflexion of the left foot, but seems to be able to perform this when moving in bed)   Skin:    General: Skin is warm and dry.     Capillary Refill: Capillary refill takes less than 2 seconds.  Neurological:     General: No focal deficit present.     Mental Status: He is alert and oriented to person, place, and time.     Comments: Sensation: Reports loss of sensation in the left foot diffusely.  Sensation  intact from the mid left calf/shin up throughout the left knee and left thigh Sensation intact in the right lower extremity and bilateral upper extremities  Strength: 5/5 strength with resisted elbow and wrist flexion and extension bilaterally 5/5 strength with resisted knee flexion and extension and ankle plantarflexion and dorsiflexion of RLE  3/5 strength with plantarflexion and dorsiflexion of left lower extremity 5/5 strength with resisted knee flexion of LLE  Psychiatric:        Mood and Affect: Mood normal.     ED Results / Procedures / Treatments   Labs (all labs ordered are listed, but only abnormal results are displayed) Labs Reviewed  CBC WITH DIFFERENTIAL/PLATELET - Abnormal; Notable for the following components:      Result Value   WBC 10.8 (*)    RBC 4.00 (*)    Hemoglobin 11.2 (*)    HCT 35.0 (*)    All other components within normal limits  COMPREHENSIVE METABOLIC PANEL WITH GFR - Abnormal; Notable for the following components:   CO2 34 (*)    Glucose, Bld 110 (*)    Total Protein 6.1 (*)    ALT 72 (*)    All other components within normal limits  TROPONIN T, HIGH SENSITIVITY - Abnormal; Notable for the following components:   Troponin T High Sensitivity 109 (*)    All other components within normal limits  TROPONIN T, HIGH SENSITIVITY - Abnormal; Notable for the following components:   Troponin T High Sensitivity 108 (*)    All other components within normal limits    EKG EKG Interpretation Date/Time:  Wednesday October 08 2023 09:39:15 EDT Ventricular Rate:  77 PR Interval:  150 QRS Duration:  94 QT Interval:  372 QTC Calculation: 421 R Axis:   80  Text Interpretation: Sinus rhythm ST elev, probable normal early repol pattern Confirmed by Hiawatha Lout ([id number]) on 10/08/2023 12:38:08 PM  Radiology CT Angio Chest PE W and/or Wo Contrast Result Date: 10/08/2023 CLINICAL DATA:  Possible syncopal episode. EXAM: CT ANGIOGRAPHY CHEST WITH CONTRAST  TECHNIQUE: Multidetector CT imaging of the chest was performed using the standard protocol during bolus administration of intravenous contrast. Multiplanar CT image reconstructions and MIPs were obtained to evaluate the vascular anatomy. RADIATION DOSE REDUCTION: This exam was performed according to the departmental dose-optimization program which includes automated exposure control, adjustment of the mA and/or kV according to patient size and/or use of iterative reconstruction technique. CONTRAST:  75mL OMNIPAQUE  IOHEXOL  350 MG/ML SOLN COMPARISON:  December 10, 2022 FINDINGS: Cardiovascular: The ascending thoracic aorta measures approximately 3.9 cm in diameter. Satisfactory opacification of the pulmonary arteries to the segmental level. No evidence of pulmonary embolism. Normal heart size. No pericardial effusion. Mediastinum/Nodes: There is mild AP window  and mild right hilar lymphadenopathy. Thyroid gland, trachea, and esophagus demonstrate no significant findings. Lungs/Pleura: There is mild to moderate severity emphysematous lung disease involving the bilateral upper lobes. A 12 mm ill-defined pulmonary nodule is seen within the lateral aspect of the right upper lobe (axial CT image 54, CT series 13). This measured 9 mm on the prior study (axial CT image 60, CT series 6) and is more prominent in appearance on the current exam. Mild lateral right middle lobe linear scarring and/or atelectasis is seen. Mild to moderate severity posterior right basilar atelectasis and/or infiltrate is also noted. No pleural effusion or pneumothorax is identified. Upper Abdomen: Multiple surgical clips are seen within the gallbladder fossa. Musculoskeletal: No chest wall abnormality. No acute or significant osseous findings. Review of the MIP images confirms the above findings. IMPRESSION: 1. No evidence of pulmonary embolism. 2. Mild to moderate severity posterior right basilar atelectasis and/or infiltrate. 3. 12 mm ill-defined  right upper lobe pulmonary nodule, as described above. Follow-up PET-CT or tissue sampling is recommended to further evaluate for the presence of an underlying neoplastic process. This recommendation follows the consensus statement: Guidelines for Management of Incidental Pulmonary Nodules Detected on CT Images: From the Fleischner Society 2017; Radiology 2017; 284:228-243. 4. Mild to moderate severity emphysematous lung disease. 5. Evidence of prior cholecystectomy. Electronically Signed   By: Virgle Grime M.D.   On: 10/08/2023 13:14   US  Venous Img Lower  Left (DVT Study) Result Date: 10/08/2023 CLINICAL DATA:  Left lower extremity pain and swelling. EXAM: Left LOWER EXTREMITY VENOUS DOPPLER ULTRASOUND TECHNIQUE: Gray-scale sonography with graded compression, as well as color Doppler and duplex ultrasound were performed to evaluate the lower extremity deep venous systems from the level of the common femoral vein and including the common femoral, femoral, profunda femoral, popliteal and calf veins including the posterior tibial, peroneal and gastrocnemius veins when visible. The superficial great saphenous vein was also interrogated. Spectral Doppler was utilized to evaluate flow at rest and with distal augmentation maneuvers in the common femoral, femoral and popliteal veins. COMPARISON:  None Available. FINDINGS: Contralateral Common Femoral Vein: Respiratory phasicity is normal and symmetric with the symptomatic side. No evidence of thrombus. Normal compressibility. Common Femoral Vein: No evidence of thrombus. Normal compressibility, respiratory phasicity and response to augmentation. Saphenofemoral Junction: No evidence of thrombus. Normal compressibility and flow on color Doppler imaging. Profunda Femoral Vein: No evidence of thrombus. Normal compressibility and flow on color Doppler imaging. Femoral Vein: No evidence of thrombus. Normal compressibility, respiratory phasicity and response to  augmentation. Popliteal Vein: No evidence of thrombus. Normal compressibility, respiratory phasicity and response to augmentation. Calf Veins: There is noncompressible vessels in the calf near the posterior tibial vein. Superficial Great Saphenous Vein: No evidence of thrombus. Normal compressibility. Venous Reflux:  None. Other Findings:  None. IMPRESSION: Calf vein thrombus near the posterior tibial vein. No above the knee DVT identified in the left lower extremity. Electronically Signed   By: Adrianna Horde M.D.   On: 10/08/2023 11:52   DG Chest 2 View Result Date: 10/08/2023 CLINICAL DATA:  Nodule in the right back EXAM: CHEST - 2 VIEW COMPARISON:  December 10, 2022 FINDINGS: The heart size and mediastinal contours are within normal limits. Both lungs are clear. The visualized skeletal structures are unremarkable. No change in the right lower lobe density against the diaphragm which probably represent diaphragmatic adhesions, pleural diaphragmatic adhesions. IMPRESSION: No acute cardiopulmonary disease.  COPD changes Electronically Signed   By: Ernestina Headland  Anette Barb M.D.   On: 10/08/2023 10:54    Procedures Procedures    Medications Ordered in ED Medications  apixaban  (ELIQUIS ) tablet 10 mg (10 mg Oral Given 10/08/23 1348)    Followed by  apixaban  (ELIQUIS ) tablet 5 mg (has no administration in time range)  ketorolac  (TORADOL ) 15 MG/ML injection 15 mg (15 mg Intravenous Given 10/08/23 1009)  iohexol  (OMNIPAQUE ) 350 MG/ML injection 100 mL (75 mLs Intravenous Contrast Given 10/08/23 1228)  fentaNYL  (SUBLIMAZE ) injection 50 mcg (50 mcg Intravenous Given 10/08/23 1347)    ED Course/ Medical Decision Making/ A&P                                 Medical Decision Making Amount and/or Complexity of Data Reviewed Labs: ordered. Radiology: ordered.  Risk Prescription drug management. Decision regarding hospitalization.     Differential diagnosis includes but is not limited to orthostatic hypotension,  seizure, ACS, arrhythmia, electrolyte abnormality, anemia, DVT, cellulitis, deep space infection, fracture, dislocation, compartment syndrome  ED Course:  Upon initial evaluation, patient is well-appearing, stable vitals aside from elevated blood pressure 133/102.  Reporting left lower extremity pain and swelling.  On exam, left lower extremity with nonpitting edema from the left foot up to the left mid thigh.  He does report loss of sensation in the left foot, but normal sensation from left ankle up to the left thigh. 2+ pedal pulses bilaterally. Compartments remain soft. Low concern for compartment syndrome at this time. No bony tenderness to palpation of the bilateral lower extremities.  Full range of motion of the bilateral lower extremities, able to ambulate on the LLE.  X-rays of the left ankle and left foot from outside hospital any acute abnormality.  Low concern for fracture or dislocation at this time.  Will proceed with ultrasound of the left lower extremity to evaluate for possible DVT.  No skin changes such as wounds, erythema of the left lower extremity, no concern for infectious etiology such as septic arthritis, cellulitis, etc at this time. Given unclear etiology to possible syncopal event, will obtain basic labs.  Labs Ordered: I Ordered, and personally interpreted labs.  The pertinent results include:   Initial troponin of 109, repeat of 108 CBC with leukocytosis of 10.8, low hemoglobin at 11.2 CMP unremarkable  Imaging Studies ordered: I ordered imaging studies including ultrasound left lower extremity, chest x-ray, CTA chest I independently visualized the imaging with scope of interpretation limited to determining acute life threatening conditions related to emergency care. Imaging showed  Chest x-ray showed no acute changes, COPD changes noted Left lower extremity ultrasound reveals calf vein thrombus in your posterior tibial vein. CTA with no evidence of pulmonary embolism.   Mild to moderate severity posterior right basilar atelectasis and/or infiltrate.  12 mm ill-defined right upper lobe pulmonary nodule.  Follow-up PET/CT or tissue sampling recommended to further evaluate for presence of an underlying neoplastic process.  Mild to moderate severity emphysematous lung disease.  Evidence of prior cholecystectomy. I agree with the radiologist interpretation   Cardiac Monitoring: / EKG: The patient was maintained on a cardiac monitor.  I personally viewed and interpreted the cardiac monitored which showed an underlying rhythm of: Normal sinus rhythm with no ST elevations   Consultations Obtained: I requested consultation with the hospitalist Dr. Gwynneth Lessen,  and discussed lab and imaging findings as well as pertinent plan - they recommend: Admission for monitoring of troponin and further management  Medications Given: Eliquis  for DVT Toradol  and fentanyl  for pain  Upon re-evaluation, patient still remains well-appearing with stable vitals aside from the elevated blood pressure.  His ultrasound of the left lower extremity does show a clot near the posterior tibial vein which would explain his pain and swelling in the left lower extremity.  He does have appropriate coloration of the foot and 2+ pedal pulses bilaterally, no concern for complete occlusion or ischemia at this time.  Will start him on Eliquis  for DVT. Patient does have elevated troponin at 109 and a repeat of 108.  Reassuring that this is remaining stable, but unclear cause at this time.  He is denying any chest pain or shortness of breath.  EKG with normal sinus rhythm and no ST changes.  No PE on CTA imaging.  I discussed this with my attending Dr. Isaiah Marc who personally evaluated patient and recommends admission for the patient. Will plan admission for further monitoring and workup. On the patient does report loss of sensation in the left foot, my attending evaluated the patient and stated patient jumped and  reported pain when bottom of his foot was scratched with a fingernail.  Low concern for any complete loss of sensation at this time.  He does not have any dermatomal distribution, no neurodeficits, no concern for stroke or spinal cord injury at this time. My attending does not feel patient needs further workup for this at this time. I discussed with patient the incidental finding of pulmonary nodule on his CTA chest imaging.  He understands that once he is discharged from the hospital, he will need to have his PCP order PET/CT imaging within the next month to further evaluate this nodule.    Impression: DVT left lower extremity Elevated troponin  Disposition:  Admission with hospitalist Dr. Gwynneth Lessen    Record Review: External records from outside source obtained and reviewed including x-ray of left ankle and left foot taken 10/02/2023 at hospital, no acute abnormality     This chart was dictated using voice recognition software, Dragon. Despite the best efforts of this provider to proofread and correct errors, errors may still occur which can change documentation meaning.          Final Clinical Impression(s) / ED Diagnoses Final diagnoses:  Leg DVT (deep venous thromboembolism), acute, left (HCC)  Elevated troponin  Pulmonary nodule    Rx / DC Orders ED Discharge Orders     None         Rexie Catena, PA-C 10/08/23 1734    Mordecai Applebaum, MD 10/14/23 1505

## 2023-10-08 NOTE — H&P (Signed)
 History and Physical      Lucas Conrad OZD:[id number] DOB: [birth year] DOA: 10/08/2023; DOS: 10/08/2023  PCP: System, Provider Not In (will further assess) Patient coming from: home   I have personally briefly reviewed patient's old medical records in [facility]  Chief Complaint: Left lower extremity pain  HPI: Lucas Conrad is a 53 y.o. male with medical history significant for essential hypertension, COPD, who is admitted to [facility] on 10/08/2023 by way of transfer for [facility] with acute DVT of the left lower extremity after presenting from home to the latter facility complaining of left lower extremity pain.   The patient reports 3 to 4 days of new left calf tenderness associated with swelling and mild erythema, in the absence of any recent trauma to the left lower extremity.  Denies any associated acute focal weakness or any associated acute focal numbness/paresthesias.  He denies any associated chest pain, palpitations, diaphoresis, nausea, vomiting, dizziness Or syncope, or syncope.  No recent shortness of breath, cough, hemoptysis.  No known history of DVT or PE.  Not currently on any blood thinners as an outpatient.,  Including no aspirin.  Per chart review, no prior echocardiogram on file.  No known history of underlying coronary artery disease.  Denies any known history of underlying malignancy.  No recent prolonged demonstrated Tory activity, and or any recent surgical procedures.  Per chart review, baseline hemoglobin appears to be 13-14.     [facility] ED Course:  Vital signs in the ED were notable for the following: Afebrile; heart rates in the 70s to 80s; systolic blood pressures in the 130s to 150s; respiratory rate 14-22, oxygen saturation 95 to 100% on room air.  Labs were notable for the following: CMP was notable for the following: Creatinine 1.06 compared to most recent prior value of 1.0 in October 2024, ALT 72.  Otherwise,  liver enzymes were within normal limits.  High sensitive troponin I was initially 109, with repeat value trending down to 108, without a prior high sensitive troponin I data points available for reported comparison.  CBC notable for white cell count 10,800, down from 14,000 on 06/07/2023, hemoglobin 11.2 associated Neuraceq/Norocarp properties and nonelevated RDW, platelet count 357.  Per my interpretation, EKG in ED demonstrated the following: Sinus rhythm with heart rate 77, normal intervals, no evidence of T wave changes, less than 1 mm ST elevation in V2, without a prior EKG available for reported comparison.  Otherwise, no evidence of ST changes.  Imaging in the ED, per corresponding formal radiology read, was notable for the following: 2 view chest x-ray showed no evidence of acute cardiopulmonary process, including no evidence of obstructive edema, effusion, or pneumothorax.  Venous ultrasound of the left lower extremity showed evidence of acute DVT near the posterior tibial vein, without evidence of DVT above the left knee.  CTA chest with PE protocol showed evidence of acute pulmonary embolism will demonstrating mild to moderate right basilar atelectasis as well as a 12 mm ill-defined right upper lobe pulmonary nodule, with associated radiology recommendation for follow-up PET/CT or tissue sampling for further evaluation.  CTA chest also showed mild to moderate emphysematous changes, will demonstrating no evidence of edema, effusion, or pneumothorax.  While in the ED, the following were administered: Eliquis 10 mg p.o. x 1 dose, fentanyl 50 mcg IV x 2 doses, Toradol 15 mg IV x 1 dose.  Subsequently, the patient was admitted for further evaluation management of presenting  acute left lower extremity DVT, with presenting labs notable for mildly elevated troponin.     Review of Systems: As per HPI otherwise 10 point review of systems negative.   Past Medical History:  Diagnosis Date   Asthma     Hypertension     Past Surgical History:  Procedure Laterality Date   CHOLECYSTECTOMY      Social History:  reports that he has been smoking cigarettes. He does not have any smokeless tobacco history on file. He reports that he does not currently use alcohol. He reports that he does not use drugs.   Allergies  Allergen Reactions   Codeine Rash    History reviewed. No pertinent family history.   Prior to Admission medications   Medication Sig Start Date End Date Taking? Authorizing Provider  traMADol  (ULTRAM ) 50 MG tablet Take 1 tablet (50 mg total) by mouth every 6 (six) hours as needed. 07/18/13   Clay Cummins, MD     Objective    Physical Exam: Vitals:   10/08/23 1400 10/08/23 1430 10/08/23 1622 10/08/23 1700  BP: (!) 162/95 (!) 154/101  (!) 147/105  Pulse: 80 76  75  Resp: 16 19  10   Temp:   98 F (36.7 C)   TempSrc:   Oral   SpO2: 97% 97%  95%  Weight:        General: appears to be stated age; alert, oriented Skin: warm, dry, no rash Head:  AT/[place] Mouth:  Oral mucosa membranes appear moist, normal dentition Neck: supple; trachea midline Heart:  RRR; did not appreciate any M/R/G Lungs: CTAB, did not appreciate any wheezes, rales, or rhonchi Abdomen: + BS; soft, ND, NT Vascular: 2+ pedal pulses b/l; 2+ radial pulses b/l Extremities:  no muscle wasting; right calf tenderness, with right lower extremity appearing slightly swollen relative to left lower extremity Neuro: strength and sensation intact in upper and lower extremities b/l     Labs on Admission: I have personally reviewed following labs and imaging studies  CBC: Recent Labs  Lab 10/08/23 0921  WBC 10.8*  NEUTROABS 7.0  HGB 11.2*  HCT 35.0*  MCV 87.5  PLT 357   Basic Metabolic Panel: Recent Labs  Lab 10/08/23 0921  NA 141  K 3.9  CL 102  CO2 34*  GLUCOSE 110*  BUN 18  CREATININE 1.06  CALCIUM 8.9   GFR: CrCl cannot be calculated (Unknown ideal weight.). Liver Function  Tests: Recent Labs  Lab 10/08/23 0921  AST 40  ALT 72*  ALKPHOS 80  BILITOT 0.4  PROT 6.1*  ALBUMIN 3.8   No results for input(s): LIPASE, AMYLASE in the last 168 hours. No results for input(s): AMMONIA in the last 168 hours. Coagulation Profile: No results for input(s): INR, PROTIME in the last 168 hours. Cardiac Enzymes: No results for input(s): CKTOTAL, CKMB, CKMBINDEX, TROPONINI in the last 168 hours. BNP (last 3 results) No results for input(s): PROBNP in the last 8760 hours. HbA1C: No results for input(s): HGBA1C in the last 72 hours. CBG: No results for input(s): GLUCAP in the last 168 hours. Lipid Profile: No results for input(s): CHOL, HDL, LDLCALC, TRIG, CHOLHDL, LDLDIRECT in the last 72 hours. Thyroid Function Tests: No results for input(s): TSH, T4TOTAL, FREET4, T3FREE, THYROIDAB in the last 72 hours. Anemia Panel: No results for input(s): VITAMINB12, FOLATE, FERRITIN, TIBC, IRON, RETICCTPCT in the last 72 hours. Urine analysis:    Component Value Date/Time   COLORURINE YELLOW 02/06/2013 1450   APPEARANCEUR CLEAR  02/06/2013 1450   LABSPEC 1.018 02/06/2013 1450   PHURINE 6.5 02/06/2013 1450   GLUCOSEU NEGATIVE 02/06/2013 1450   HGBUR NEGATIVE 02/06/2013 1450   BILIRUBINUR NEGATIVE 02/06/2013 1450   KETONESUR NEGATIVE 02/06/2013 1450   PROTEINUR NEGATIVE 02/06/2013 1450   UROBILINOGEN 1.0 02/06/2013 1450   NITRITE NEGATIVE 02/06/2013 1450   LEUKOCYTESUR SMALL (A) 02/06/2013 1450    Radiological Exams on Admission: CT Angio Chest PE W and/or Wo Contrast Result Date: 10/08/2023 CLINICAL DATA:  Possible syncopal episode. EXAM: CT ANGIOGRAPHY CHEST WITH CONTRAST TECHNIQUE: Multidetector CT imaging of the chest was performed using the standard protocol during bolus administration of intravenous contrast. Multiplanar CT image reconstructions and MIPs were obtained to evaluate the vascular anatomy. RADIATION  DOSE REDUCTION: This exam was performed according to the departmental dose-optimization program which includes automated exposure control, adjustment of the mA and/or kV according to patient size and/or use of iterative reconstruction technique. CONTRAST:  75mL OMNIPAQUE  IOHEXOL  350 MG/ML SOLN COMPARISON:  December 10, 2022 FINDINGS: Cardiovascular: The ascending thoracic aorta measures approximately 3.9 cm in diameter. Satisfactory opacification of the pulmonary arteries to the segmental level. No evidence of pulmonary embolism. Normal heart size. No pericardial effusion. Mediastinum/Nodes: There is mild AP window and mild right hilar lymphadenopathy. Thyroid gland, trachea, and esophagus demonstrate no significant findings. Lungs/Pleura: There is mild to moderate severity emphysematous lung disease involving the bilateral upper lobes. A 12 mm ill-defined pulmonary nodule is seen within the lateral aspect of the right upper lobe (axial CT image 54, CT series 13). This measured 9 mm on the prior study (axial CT image 60, CT series 6) and is more prominent in appearance on the current exam. Mild lateral right middle lobe linear scarring and/or atelectasis is seen. Mild to moderate severity posterior right basilar atelectasis and/or infiltrate is also noted. No pleural effusion or pneumothorax is identified. Upper Abdomen: Multiple surgical clips are seen within the gallbladder fossa. Musculoskeletal: No chest wall abnormality. No acute or significant osseous findings. Review of the MIP images confirms the above findings. IMPRESSION: 1. No evidence of pulmonary embolism. 2. Mild to moderate severity posterior right basilar atelectasis and/or infiltrate. 3. 12 mm ill-defined right upper lobe pulmonary nodule, as described above. Follow-up PET-CT or tissue sampling is recommended to further evaluate for the presence of an underlying neoplastic process. This recommendation follows the consensus statement: Guidelines for  Management of Incidental Pulmonary Nodules Detected on CT Images: From the Fleischner Society 2017; Radiology 2017; 284:228-243. 4. Mild to moderate severity emphysematous lung disease. 5. Evidence of prior cholecystectomy. Electronically Signed   By: Virgle Grime M.D.   On: 10/08/2023 13:14   US  Venous Img Lower  Left (DVT Study) Result Date: 10/08/2023 CLINICAL DATA:  Left lower extremity pain and swelling. EXAM: Left LOWER EXTREMITY VENOUS DOPPLER ULTRASOUND TECHNIQUE: Gray-scale sonography with graded compression, as well as color Doppler and duplex ultrasound were performed to evaluate the lower extremity deep venous systems from the level of the common femoral vein and including the common femoral, femoral, profunda femoral, popliteal and calf veins including the posterior tibial, peroneal and gastrocnemius veins when visible. The superficial great saphenous vein was also interrogated. Spectral Doppler was utilized to evaluate flow at rest and with distal augmentation maneuvers in the common femoral, femoral and popliteal veins. COMPARISON:  None Available. FINDINGS: Contralateral Common Femoral Vein: Respiratory phasicity is normal and symmetric with the symptomatic side. No evidence of thrombus. Normal compressibility. Common Femoral Vein: No evidence of thrombus. Normal compressibility,  respiratory phasicity and response to augmentation. Saphenofemoral Junction: No evidence of thrombus. Normal compressibility and flow on color Doppler imaging. Profunda Femoral Vein: No evidence of thrombus. Normal compressibility and flow on color Doppler imaging. Femoral Vein: No evidence of thrombus. Normal compressibility, respiratory phasicity and response to augmentation. Popliteal Vein: No evidence of thrombus. Normal compressibility, respiratory phasicity and response to augmentation. Calf Veins: There is noncompressible vessels in the calf near the posterior tibial vein. Superficial Great Saphenous Vein: No  evidence of thrombus. Normal compressibility. Venous Reflux:  None. Other Findings:  None. IMPRESSION: Calf vein thrombus near the posterior tibial vein. No above the knee DVT identified in the left lower extremity. Electronically Signed   By: Adrianna Horde M.D.   On: 10/08/2023 11:52   DG Chest 2 View Result Date: 10/08/2023 CLINICAL DATA:  Nodule in the right back EXAM: CHEST - 2 VIEW COMPARISON:  December 10, 2022 FINDINGS: The heart size and mediastinal contours are within normal limits. Both lungs are clear. The visualized skeletal structures are unremarkable. No change in the right lower lobe density against the diaphragm which probably represent diaphragmatic adhesions, pleural diaphragmatic adhesions. IMPRESSION: No acute cardiopulmonary disease.  COPD changes Electronically Signed   By: Fredrich Jefferson M.D.   On: 10/08/2023 10:54      Assessment/Plan   Principal Problem:   Acute deep vein thrombosis (DVT) of calf muscle vein of left lower extremity (HCC) Active Problems:   Elevated troponin   History of essential hypertension   Acute anemia   Pulmonary nodule   History of COPD    #) Acute left lower extremity DVT: In the setting of 3 to 4 days of new onset left lower extremity calf tenderness, associated with unilateral swelling, mild erythema, left lower extremity venous ultrasound was notable for acute DVT near the posterior tibial vein, without any evidence of acute DVT above the level of the left knee.  Appears to be patient's first DVT, and no known history of PE, while CTA chest performed today shows no evidence of acute pulmonary embolism.  Started on Eliquis at [employer].  Patient's right upper lobe pulmonary nodule on today's CTA chest is notable, in the context of no known history of underlying malignancy.  Plan: Continue Eliquis, as above.  Prn IV Dilaudid .  Prn IV Toradol.  Incentive spirometry ordered.                    #) Elevated troponin:  Mildly elevated troponin at 109, with repeat trending down to 108, in the absence of any previous high-sensitivity troponin I data points available for comparison.  Suspect contribution from type II supply/demand mismatch, including suspected contribution from diminished oxygen delivery capacity resulting from diminished oxygen carrying capacity as a consequence of presenting acute anemia, as further detailed below.With any chest pain.  ACS appears less likely, including in the absence of any acute ischemic changes on presenting EKG.  CTA chest showed minutes of acute pulmonary embolism or any evidence of pneumothorax.  Plan: Echocardiogram in the morning.  Monitor on telemetry.  Further evaluation management of presenting acute anemia, as below.                         #) acute normocytic anemia: Relative to baseline hemoglobin range of 13-14, presenting hemoglobin slightly lower at 11.2 associated Neuraceq properties.  No evidence of acute blood loss, although will pursue additional evaluation and trending of hemoglobin, particularly  given plan for Eliquis in the setting of acute left lower extremity DVT.  Plan-: Check iron studies, reticulocyte count, B12, folate acid level, PTT, INR.  Repeat CBC in the morning.                  #) Right upper lobe pulmonary nodule: Identified on today CTA chest with PE protocol, with corresponding radiology recommendation for follow-up PET/CT or tissue sampling for further evaluation.  Plan: Will convey radiology's recommendation for follow-up PET CT scan or tissue sampling, as above.                  #) COPD: Documented history thereof, without clinical evidence of acute exacerbation at this time.   Outpatient respiratory regimen includes the following:  none.  Chronic emphysematous changes noted on CTA chest, as above.  Plan: Prn albuterol nebulizer. Check CMP and serum magnesium level in the AM.                        #) Essential Hypertension: documented h/o such, managed via lifestyle modifications, in the absence of any outpatient antihypertensive medications.SBP's in the ED today: 130s to 150s mmHg.   Plan: Close monitoring of subsequent BP via routine VS. prn IV hydralazine for systolic blood pressure greater than 170 mmHg.      DVT prophylaxis: SCD's   Code Status: Full code Family Communication: none Disposition Plan: Per Rounding Team Consults called: none;  Admission status: Observation     I SPENT GREATER THAN 75  MINUTES IN CLINICAL CARE TIME/MEDICAL DECISION-MAKING IN COMPLETING THIS ADMISSION.      Sedalia Greeson B Cherye Gaertner DO [facility]  From 7PM - 7AM   10/08/2023, 11:16 PM

## 2023-10-08 NOTE — ED Notes (Signed)
 Called [facility] for transport.

## 2023-10-08 NOTE — ED Notes (Addendum)
 Error note entry, disregard

## 2023-10-09 ENCOUNTER — Telehealth: Payer: Self-pay | Admitting: Internal Medicine

## 2023-10-09 ENCOUNTER — Observation Stay (HOSPITAL_COMMUNITY): Payer: MEDICAID

## 2023-10-09 ENCOUNTER — Other Ambulatory Visit: Payer: Self-pay

## 2023-10-09 ENCOUNTER — Telehealth (HOSPITAL_COMMUNITY): Payer: Self-pay | Admitting: Pharmacy Technician

## 2023-10-09 ENCOUNTER — Other Ambulatory Visit (HOSPITAL_COMMUNITY): Payer: Self-pay

## 2023-10-09 DIAGNOSIS — R918 Other nonspecific abnormal finding of lung field: Secondary | ICD-10-CM

## 2023-10-09 DIAGNOSIS — D649 Anemia, unspecified: Secondary | ICD-10-CM | POA: Diagnosis present

## 2023-10-09 DIAGNOSIS — I82462 Acute embolism and thrombosis of left calf muscular vein: Secondary | ICD-10-CM | POA: Diagnosis present

## 2023-10-09 DIAGNOSIS — R748 Abnormal levels of other serum enzymes: Secondary | ICD-10-CM | POA: Diagnosis not present

## 2023-10-09 DIAGNOSIS — R7989 Other specified abnormal findings of blood chemistry: Secondary | ICD-10-CM | POA: Diagnosis not present

## 2023-10-09 DIAGNOSIS — I82402 Acute embolism and thrombosis of unspecified deep veins of left lower extremity: Principal | ICD-10-CM

## 2023-10-09 DIAGNOSIS — Z8709 Personal history of other diseases of the respiratory system: Secondary | ICD-10-CM

## 2023-10-09 DIAGNOSIS — I10 Essential (primary) hypertension: Secondary | ICD-10-CM | POA: Diagnosis not present

## 2023-10-09 DIAGNOSIS — Z8679 Personal history of other diseases of the circulatory system: Secondary | ICD-10-CM

## 2023-10-09 DIAGNOSIS — R911 Solitary pulmonary nodule: Secondary | ICD-10-CM | POA: Diagnosis present

## 2023-10-09 LAB — COMPREHENSIVE METABOLIC PANEL WITH GFR
ALT: 83 U/L — ABNORMAL HIGH (ref 0–44)
AST: 54 U/L — ABNORMAL HIGH (ref 15–41)
Albumin: 3 g/dL — ABNORMAL LOW (ref 3.5–5.0)
Alkaline Phosphatase: 76 U/L (ref 38–126)
Anion gap: 9 (ref 5–15)
BUN: 12 mg/dL (ref 6–20)
CO2: 27 mmol/L (ref 22–32)
Calcium: 8.9 mg/dL (ref 8.9–10.3)
Chloride: 103 mmol/L (ref 98–111)
Creatinine, Ser: 0.9 mg/dL (ref 0.61–1.24)
GFR, Estimated: >60 mL/min (ref >60)
Glucose, Bld: 108 mg/dL — ABNORMAL HIGH (ref 70–99)
Potassium: 4 mmol/L (ref 3.5–5.1)
Sodium: 139 mmol/L (ref 135–145)
Total Bilirubin: 0.5 mg/dL (ref 0.0–1.2)
Total Protein: 5.5 g/dL — ABNORMAL LOW (ref 6.5–8.1)

## 2023-10-09 LAB — CBC WITH DIFFERENTIAL/PLATELET
Abs Immature Granulocytes: 0.04 10*3/uL (ref 0.00–0.07)
Basophils Absolute: 0.1 10*3/uL (ref 0.0–0.1)
Basophils Relative: 1 %
Eosinophils Absolute: 0.3 10*3/uL (ref 0.0–0.5)
Eosinophils Relative: 3 %
HCT: 35.9 % — ABNORMAL LOW (ref 39.0–52.0)
Hemoglobin: 11.7 g/dL — ABNORMAL LOW (ref 13.0–17.0)
Immature Granulocytes: 0 %
Lymphocytes Relative: 24 %
Lymphs Abs: 2.3 10*3/uL (ref 0.7–4.0)
MCH: 28 pg (ref 26.0–34.0)
MCHC: 32.6 g/dL (ref 30.0–36.0)
MCV: 85.9 fL (ref 80.0–100.0)
Monocytes Absolute: 1 10*3/uL (ref 0.1–1.0)
Monocytes Relative: 10 %
Neutro Abs: 6.1 10*3/uL (ref 1.7–7.7)
Neutrophils Relative %: 62 %
Platelets: 404 10*3/uL — ABNORMAL HIGH (ref 150–400)
RBC: 4.18 MIL/uL — ABNORMAL LOW (ref 4.22–5.81)
RDW: 14.6 % (ref 11.5–15.5)
WBC: 9.9 10*3/uL (ref 4.0–10.5)
nRBC: 0 % (ref 0.0–0.2)

## 2023-10-09 LAB — ECHOCARDIOGRAM COMPLETE
AR max vel: 3.32 cm2
AV Area VTI: 3.54 cm2
AV Area mean vel: 3.38 cm2
AV Mean grad: 4 mmHg
AV Peak grad: 9.1 mmHg
Ao pk vel: 1.51 m/s
Area-P 1/2: 3.99 cm2
Height: 69 in
S' Lateral: 3.6 cm
Weight: 2631.41 [oz_av]

## 2023-10-09 LAB — URINALYSIS, COMPLETE (UACMP) WITH MICROSCOPIC
Bacteria, UA: NONE SEEN
Bilirubin Urine: NEGATIVE
Glucose, UA: NEGATIVE mg/dL
Hgb urine dipstick: NEGATIVE
Ketones, ur: NEGATIVE mg/dL
Leukocytes,Ua: NEGATIVE
Nitrite: NEGATIVE
Protein, ur: NEGATIVE mg/dL
Specific Gravity, Urine: 1.006 (ref 1.005–1.030)
pH: 9 — ABNORMAL HIGH (ref 5.0–8.0)

## 2023-10-09 LAB — RAPID URINE DRUG SCREEN, HOSP PERFORMED
Amphetamines: NOT DETECTED
Barbiturates: NOT DETECTED
Benzodiazepines: NOT DETECTED
Cocaine: NOT DETECTED
Opiates: NOT DETECTED
Tetrahydrocannabinol: NOT DETECTED

## 2023-10-09 LAB — PROTIME-INR
INR: 1.1 (ref 0.8–1.2)
Prothrombin Time: 14.8 s (ref 11.4–15.2)

## 2023-10-09 LAB — PROCALCITONIN: Procalcitonin: 0.35 ng/mL

## 2023-10-09 LAB — RETICULOCYTES
Immature Retic Fract: 12.9 % (ref 2.3–15.9)
RBC.: 4.13 MIL/uL — ABNORMAL LOW (ref 4.22–5.81)
Retic Count, Absolute: 85.1 10*3/uL (ref 19.0–186.0)
Retic Ct Pct: 2.1 % (ref 0.4–3.1)

## 2023-10-09 LAB — FERRITIN: Ferritin: 86 ng/mL (ref 24–336)

## 2023-10-09 LAB — VITAMIN B12: Vitamin B-12: 579 pg/mL (ref 180–914)

## 2023-10-09 LAB — IRON AND TIBC
Iron: 55 ug/dL (ref 45–182)
Saturation Ratios: 14 % — ABNORMAL LOW (ref 17.9–39.5)
TIBC: 403 ug/dL (ref 250–450)
UIBC: 348 ug/dL

## 2023-10-09 LAB — APTT: aPTT: 32 s (ref 24–36)

## 2023-10-09 LAB — FOLATE: Folate: 9.9 ng/mL (ref >5.9)

## 2023-10-09 LAB — SEDIMENTATION RATE: Sed Rate: 4 mm/h (ref 0–16)

## 2023-10-09 LAB — MAGNESIUM: Magnesium: 1.5 mg/dL — ABNORMAL LOW (ref 1.7–2.4)

## 2023-10-09 MED ORDER — KETOROLAC TROMETHAMINE 15 MG/ML IJ SOLN: 15.0000 mg | Freq: Four times a day (QID) | INTRAMUSCULAR | Status: DC | PRN

## 2023-10-09 MED ORDER — MAGNESIUM SULFATE 4 GM/100ML IV SOLN
4.0000 g | Freq: Once | INTRAVENOUS | Status: AC
  Administered 2023-10-09: 4 g via INTRAVENOUS
  Filled 2023-10-09: qty 100

## 2023-10-09 MED ORDER — APIXABAN 5 MG PO TABS: 5.0000 mg | ORAL_TABLET | Freq: Two times a day (BID) | ORAL | 2 refills | Status: AC

## 2023-10-09 MED ORDER — HYDROMORPHONE HCL 1 MG/ML IJ SOLN
1.0000 mg | INTRAMUSCULAR | Status: DC | PRN
  Administered 2023-10-09 (×4): 1 mg via INTRAVENOUS
  Filled 2023-10-09 (×4): qty 1

## 2023-10-09 MED ORDER — NICOTINE 21 MG/24HR TD PT24: 21.0000 mg | MEDICATED_PATCH | Freq: Every day | TRANSDERMAL | Status: DC | PRN

## 2023-10-09 MED ORDER — ACETAMINOPHEN 325 MG PO TABS: 650.0000 mg | ORAL_TABLET | Freq: Four times a day (QID) | ORAL | Status: AC | PRN

## 2023-10-09 MED ORDER — NICOTINE 21 MG/24HR TD PT24
21.0000 mg | MEDICATED_PATCH | Freq: Every day | TRANSDERMAL | Status: DC
  Administered 2023-10-09: 21 mg via TRANSDERMAL
  Filled 2023-10-09: qty 1

## 2023-10-09 MED ORDER — APIXABAN (ELIQUIS) VTE STARTER PACK (10MG AND 5MG)
ORAL_TABLET | ORAL | 0 refills | Status: AC
  Filled 2023-10-09: qty 74, 30d supply, fill #0

## 2023-10-09 MED ORDER — ALBUTEROL SULFATE (2.5 MG/3ML) 0.083% IN NEBU: 2.5000 mg | INHALATION_SOLUTION | RESPIRATORY_TRACT | Status: DC | PRN

## 2023-10-09 MED ORDER — CEFADROXIL 500 MG PO CAPS
1000.0000 mg | ORAL_CAPSULE | Freq: Two times a day (BID) | ORAL | Status: DC
  Administered 2023-10-09: 1000 mg via ORAL
  Filled 2023-10-09 (×2): qty 2

## 2023-10-09 NOTE — Consult Note (Signed)
 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  He presented with pain from the DVT left lower extremity.  He has a known right upper lobe nodules last seen 9 mm now was increased to 12 mm and pulmonary critical care asked to evaluate for possible follow-up of bronchoscopy and tissue diagnosis.  After 1 during conversation with him he does not appear interested in undergoing fiberoptic bronchoscopy at this time.  Pertinent  Medical History   Past Medical History:  Diagnosis Date   Asthma    Hypertension      Significant Hospital Events: Including procedures, antibiotic start and stop dates in addition to other pertinent events     Interim History / Subjective:  Admitted with pain from DVT  Objective    Blood pressure (!) 147/80, pulse 85, temperature 98 F (36.7 C), temperature source Oral, resp. rate 17, height 5' 9 (1.753 m), weight 74.6 kg, SpO2 97%.        Intake/Output Summary (Last 24 hours) at 10/09/2023 1128 Last data filed at 10/09/2023 0839 Gross per 24 hour  Intake 240 ml  Output 500 ml  Net -260 ml   Filed Weights   10/08/23 0906 10/09/23 0318  Weight: 74.8 kg 74.6 kg    Examination: General: Normal appearing male with large amount of body are. HENT: Lower teeth are severe dental caries Lungs: Diminished in the bases Cardiovascular: Heart sounds are regular Abdomen: Soft nontender positive bowel sounds Extremities: Right lower extremity tender but not swollen Neuro: Grossly intact.  No defects does not seem to have attention deficit disorder GU: Voids  Resolved problem list   Assessment and Plan  Right upper lobe mass which:  Millimeters and is increased from 9 mm approximately 3 months ago.  Pulmonary critical care asked to evaluate for possible pulmonary evaluation with fiberoptic bronchoscopy. Note he is currently on anticoagulation Does not seem to be interested in undergoing fiberoptic bronchoscopy at time Current smoker  Right leg deep vein thrombosis Currently on anticoagulation   Hypertension Noncompliant with medications See dictated from our Best Practice (right click and Reselect all SmartList Selections daily)   Diet/type: Regular consistency (see orders) DVT prophylaxis DOAC Pressure ulcer(s): N/A GI prophylaxis: PPI Lines: N/A Foley:  N/A Code Status:  full code Last date of multidisciplinary goals of care discussion [TBD]  Labs   CBC: Recent Labs  Lab 10/08/23 0921 10/09/23 0450  WBC 10.8* 9.9  NEUTROABS 7.0 6.1  HGB 11.2* 11.7*  HCT 35.0* 35.9*  MCV 87.5 85.9  PLT 357 404*    Basic Metabolic Panel: Recent Labs  Lab 10/08/23 0921 10/09/23 0450  NA 141 139  K 3.9 4.0  CL 102 103  CO2 34* 27  GLUCOSE 110* 108*  BUN 18 12  CREATININE 1.06 0.90  CALCIUM 8.9 8.9  MG  --  1.5*   GFR: Estimated Creatinine Clearance: 94.9 mL/min (by C-G formula based on SCr of 0.9 mg/dL). Recent Labs  Lab 10/08/23 0921 10/09/23 0450  PROCALCITON  --  0.35  WBC 10.8* 9.9    Liver Function Tests: Recent Labs  Lab 10/08/23 0921 10/09/23 0450  AST 40 54*  ALT 72* 83*  ALKPHOS 80  76  BILITOT 0.4 0.5  PROT 6.1* 5.5*  ALBUMIN 3.8 3.0*   No results for input(s): LIPASE, AMYLASE in the last 168 hours. No results for input(s): AMMONIA in the last 168 hours.  ABG No results found for: PHART, PCO2ART, PO2ART, HCO3, TCO2, ACIDBASEDEF, O2SAT   Coagulation Profile: Recent Labs  Lab 10/09/23 0450  INR 1.1    Cardiac Enzymes: No results for input(s): CKTOTAL, CKMB, CKMBINDEX, TROPONINI in the last 168 hours.  HbA1C: No results found for:  HGBA1C  CBG: No results for input(s): GLUCAP in the last 168 hours.  Review of Systems:   10 point review of system taken, please see HPI for positives and negatives.   Past Medical History:  He,  has a past medical history of Asthma and Hypertension.   Surgical History:   Past Surgical History:  Procedure Laterality Date   CHOLECYSTECTOMY       Social History:   reports that he has been smoking cigarettes. He does not have any smokeless tobacco history on file. He reports that he does not currently use alcohol. He reports that he does not use drugs.   Family History:  His family history is not on file.   Allergies Allergies  Allergen Reactions   Codeine Rash     Home Medications  Prior to Admission medications   Medication Sig Start Date End Date Taking? Authorizing Provider  traMADol  (ULTRAM ) 50 MG tablet Take 1 tablet (50 mg total) by mouth every 6 (six) hours as needed. 07/18/13   Clay Cummins, MD     Critical care time: Bonnee Bustle Lannette Avellino ACNP Acute Care Nurse Practitioner Jonny Neu Pulmonary/Critical Care Please consult Amion 10/09/2023, 11:28 AM

## 2023-10-09 NOTE — Telephone Encounter (Signed)
 Patient Product/process development scientist completed.    The patient is insured through [employer]. Patient has [employer], may use a copay card, and/or apply for patient assistance if available.    Ran test claim for Eliquis 5 mg and the current 30 day co-pay is $0.00.  Ran test claim for Xarelto 20 mg and the current 30 day co-pay is $0.00.  This test claim was processed through [facility]- copay amounts may vary at other pharmacies due to pharmacy/plan contracts, or as the patient moves through the different stages of their insurance plan.     Morgan Arab, CPHT Pharmacy Technician III Certified Patient Advocate [facility] Patient Advocate Team Direct Number: [phone]  Fax: [phone]

## 2023-10-09 NOTE — Telephone Encounter (Signed)
 Next available nodule slot (hopefully within 2-3 weeks, let us  know if cannot get in).

## 2023-10-09 NOTE — TOC CM/SW Note (Signed)
 Transition of Care [facility]) - Inpatient Brief Assessment   Patient Details  Name: Biruk Troia MRN: [id number] Date of Birth: 09-Apr-1971  [facility] CM/SW Contact:    Cosimo Diones, RN Phone Number: 10/09/2023, 1:12 PM   Clinical Narrative: Patient presented for left lower extremity pain-found to have an acute DVT. PTA patient reports that he is homeless and has a sister in [place]. Patient states he does not have a PCP- Will have CMA arrange a hospital follow up appointment to become established in [place]. Patient is asking for an inpatient recovery center e.g. [facility]. CSW is aware and will provide resources. Case Manager will continue to follow for additional transition of care needs.    Transition of Care Asessment: Insurance and Status: Insurance coverage has been reviewed Patient has primary care physician: No Home environment has been reviewed: Patient reports that he is homeless. Prior level of function:: Independent Prior/Current Home Services: No current home services Social Drivers of Health Review: SDOH reviewed needs interventions (CSW to follow) Readmission risk has been reviewed: Yes Transition of care needs: transition of care needs identified, TOC will continue to follow

## 2023-10-09 NOTE — Evaluation (Signed)
 Occupational Therapy Evaluation Patient Details Name: Lucas Conrad MRN: [id number] DOB: 01-09-1971 Today's Date: 10/09/2023   History of Present Illness   Pt is a 53 y.o. male who presented 10/08/23 with L leg pain. 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 PMH: asthma, HTN, COPD     Clinical Impressions Lucas Conrad was evaluated s/p the above admission list. He is unhoused but indep at baseline. Upon evaluation, pt demonstrated mod I ability to complete mobility and ADLs without use of AD, however pt states he has access to RW if needed. Increased time spent reviewing medication management and the importance to take new medications as prescribed (eliquis), pt had good verbal recall by the end of the session. Pt does not require further acute, or follow up OT services. Recommend discharge back to pt's environment with assist as needed. OT to sign off with appreciation of order, please re-consult if needed.       If plan is discharge home, recommend the following:   A little help with walking and/or transfers;Assist for transportation     Functional Status Assessment   Patient has had a recent decline in their functional status and demonstrates the ability to make significant improvements in function in a reasonable and predictable amount of time.     Equipment Recommendations   None recommended by OT      Precautions/Restrictions   Precautions Precautions: Fall Restrictions Weight Bearing Restrictions Per Provider Order: No     Mobility Bed Mobility Overal bed mobility: Modified Independent             General bed mobility comments: HOB slightly elevated, no assistance needed to transition supine <> sit L EOB    Transfers Overall transfer level: Needs assistance Equipment used: None Transfers: Sit to/from Stand Sit to Stand: Supervision           General transfer comment: pt declined stair negotiation despite encouragement       Balance Overall balance assessment: Needs assistance Sitting-balance support: No upper extremity supported, Feet supported Sitting balance-Leahy Scale: Normal     Standing balance support: No upper extremity supported, During functional activity Standing balance-Leahy Scale: Fair               ADL either performed or assessed with clinical judgement   ADL Overall ADL's : Modified independent         General ADL Comments: no AD, pt states he has access to a RW if needed     Vision Baseline Vision/History: 0 No visual deficits Vision Assessment?: No apparent visual deficits     Perception Perception: Within Functional Limits       Praxis Praxis: WFL       Pertinent Vitals/Pain Pain Assessment Pain Assessment: Faces Faces Pain Scale: Hurts little more Pain Location: L foot Pain Descriptors / Indicators: Discomfort, Grimacing, Guarding, Pins and needles Pain Intervention(s): Limited activity within patient's tolerance, Monitored during session     Extremity/Trunk Assessment Upper Extremity Assessment Upper Extremity Assessment: Overall WFL for tasks assessed   Lower Extremity Assessment Lower Extremity Assessment: Defer to PT evaluation   Cervical / Trunk Assessment Cervical / Trunk Assessment: Normal   Communication Communication Communication: No apparent difficulties   Cognition Arousal: Alert Behavior During Therapy: Flat affect Cognition: No family/caregiver present to determine baseline             OT - Cognition Comments: overall WFL. Low health literacy. Increased time spent on edication management and the  importance of new medication (eliquis)                 Following commands: Intact       Cueing  General Comments   Cueing Techniques: Verbal cues  VSS, pt prepard for discharge           Home Living Family/patient expects to be discharged to:: Shelter/Homeless Living Arrangements: Alone             Additional  Comments: No assistance available at d/c per pt; pt has a RW that he just got. Plans to stay in a rehab house, pt was unable to recall facility name. He states there are no steps and walk in showers there.      Prior Functioning/Environment Prior Level of Function : Independent/Modified Independent             Mobility Comments: has a RW but typically does not need it per pt ADLs Comments: indep    OT Problem List: Decreased range of motion;Decreased activity tolerance;Impaired balance (sitting and/or standing);Decreased safety awareness   OT Treatment/Interventions:        OT Goals(Current goals can be found in the care plan section)   Acute Rehab OT Goals Patient Stated Goal: to leave hospital OT Goal Formulation: With patient Time For Goal Achievement: 10/23/23 Potential to Achieve Goals: Good   AM-PAC OT 6 Clicks Daily Activity     Outcome Measure Help from another person eating meals?: None Help from another person taking care of personal grooming?: None Help from another person toileting, which includes using toliet, bedpan, or urinal?: None Help from another person bathing (including washing, rinsing, drying)?: None Help from another person to put on and taking off regular upper body clothing?: None Help from another person to put on and taking off regular lower body clothing?: None 6 Click Score: 24   End of Session Nurse Communication: Mobility status  Activity Tolerance: Patient tolerated treatment well Patient left: in bed;with call bell/phone within reach  OT Visit Diagnosis: Unsteadiness on feet (R26.81);Other abnormalities of gait and mobility (R26.89)                Time: 1351-1405 OT Time Calculation (min): 14 min Charges:  OT General Charges $OT Visit: 1 Visit OT Evaluation $OT Eval Low Complexity: 1 Low  Veryl Gottron, OTR/L Acute Rehabilitation Services Office [phone] Secure Chat Communication Preferred   Starla Easterly 10/09/2023, 2:29 PM

## 2023-10-09 NOTE — Care Management Obs Status (Deleted)
 MEDICARE OBSERVATION STATUS NOTIFICATION   Patient Details  Name: Lucas Conrad MRN: [id number] Date of Birth: November 11, 1970   Medicare Observation Status Notification Given:  Yes    Cosimo Diones, RN 10/09/2023, 1:01 PM

## 2023-10-09 NOTE — Evaluation (Signed)
 Physical Therapy Evaluation Patient Details Name: Lucas Conrad MRN: [id number] DOB: Jun 14, 1970 Today's Date: 10/09/2023  History of Present Illness  Pt is a 53 y.o. male who presented 10/08/23 with L leg pain. 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 PMH: asthma, HTN, COPD  Clinical Impression  Pt presents with condition above and deficits mentioned below, see PT Problem List. PTA, he was independent for all functional mobility and ADLs. He reports he is currently homeless, but recently acquired a RW. Currently, he reports diminished to no sensation inferior to his L knee, particularly posteriorly and laterally. He also demonstrates 0/5 MMT inferior to his L knee. Notified MD of these sensory and strength deficits. These deficits impact his balance and gait pattern. He is currently ambulating with an unsteady, antalgic gait pattern with L foot drop. He is requiring CGA for safety with all standing mobility with the pt often reaching out for furniture or a wall rail for support to ambulate. Encouraged pt to use his RW at this time. At this time, he may benefit from follow-up with OPPT to address his L lower extremity deficits, balance, and independence with mobility. Will continue to follow acutely.        If plan is discharge home, recommend the following: A little help with bathing/dressing/bathroom;Assistance with cooking/housework;Assist for transportation;Help with stairs or ramp for entrance   Can travel by private vehicle        Equipment Recommendations None recommended by PT  Recommendations for Other Services       Functional Status Assessment Patient has had a recent decline in their functional status and demonstrates the ability to make significant improvements in function in a reasonable and predictable amount of time.     Precautions / Restrictions Precautions Precautions: Fall Restrictions Weight Bearing Restrictions Per Provider Order: No       Mobility  Bed Mobility Overal bed mobility: Modified Independent             General bed mobility comments: HOB slightly elevated, no assistance needed to transition supine <> sit L EOB    Transfers Overall transfer level: Needs assistance Equipment used: None Transfers: Sit to/from Stand Sit to Stand: Supervision           General transfer comment: Able to come to stand from EOB with supervision for safety, mild sway noted but no LOB    Ambulation/Gait Ambulation/Gait assistance: Contact guard assist Gait Distance (Feet): 180 Feet Assistive device: None (intermittent x1 UE support on wall rail or furniture) Gait Pattern/deviations: Decreased dorsiflexion - left, Step-through pattern, Decreased stance time - left, Decreased stride length, Decreased weight shift to left, Antalgic Gait velocity: reduced Gait velocity interpretation: 1.31 - 2.62 ft/sec, indicative of limited community ambulator   General Gait Details: Pt ambulates with an antalgic gait pattern with reduced L weight shift and stance time due to pain and with L foot drop due to muscle weakness. Pt intermittently reaching out for the wall rail or furniture for x1 UE support while ambulating. No LOB, CGA for safety  Stairs            Wheelchair Mobility     Tilt Bed    Modified Rankin (Stroke Patients Only)       Balance Overall balance assessment: Needs assistance Sitting-balance support: No upper extremity supported, Feet supported Sitting balance-Leahy Scale: Good     Standing balance support: Single extremity supported, No upper extremity supported, During functional activity Standing balance-Leahy Scale:  Fair Standing balance comment: able to stand statically without UE support, but tends to prefer UE support to ambulate                             Pertinent Vitals/Pain Pain Assessment Pain Assessment: Faces Faces Pain Scale: Hurts even more Pain Location: L leg Pain  Descriptors / Indicators: Discomfort, Grimacing, Guarding Pain Intervention(s): Limited activity within patient's tolerance, Monitored during session, Repositioned    Home Living Family/patient expects to be discharged to:: Shelter/Homeless Living Arrangements: Alone                 Additional Comments: No assistance available at d/c per pt; pt has a RW that he just got    Prior Function Prior Level of Function : Independent/Modified Independent             Mobility Comments: has a RW but typically does not need it per pt       Extremity/Trunk Assessment   Upper Extremity Assessment Upper Extremity Assessment: Defer to OT evaluation    Lower Extremity Assessment Lower Extremity Assessment: LLE deficits/detail LLE Deficits / Details: 0/5 MMT inferior to knee; able to extend knee against gravity; reports numbness inferior to knee, posteriorly and laterally in particular LLE Sensation: decreased light touch    Cervical / Trunk Assessment Cervical / Trunk Assessment: Normal  Communication   Communication Communication: No apparent difficulties    Cognition Arousal: Alert Behavior During Therapy: Flat affect   PT - Cognitive impairments: No apparent impairments                         Following commands: Intact       Cueing Cueing Techniques: Verbal cues     General Comments General comments (skin integrity, edema, etc.): found cigarettes and lighter in pt's bed, confiscated them and provided them to RN    Exercises     Assessment/Plan    PT Assessment Patient needs continued PT services  PT Problem List Decreased strength;Decreased range of motion;Decreased activity tolerance;Decreased balance;Decreased mobility;Impaired sensation;Pain       PT Treatment Interventions DME instruction;Gait training;Stair training;Functional mobility training;Therapeutic exercise;Therapeutic activities;Balance training;Neuromuscular  re-education;Patient/family education    PT Goals (Current goals can be found in the Care Plan section)  Acute Rehab PT Goals Patient Stated Goal: to improve PT Goal Formulation: With patient Time For Goal Achievement: 10/23/23 Potential to Achieve Goals: Good    Frequency Min 2X/week     Co-evaluation               AM-PAC PT 6 Clicks Mobility  Outcome Measure Help needed turning from your back to your side while in a flat bed without using bedrails?: None Help needed moving from lying on your back to sitting on the side of a flat bed without using bedrails?: None Help needed moving to and from a bed to a chair (including a wheelchair)?: A Little Help needed standing up from a chair using your arms (e.g., wheelchair or bedside chair)?: A Little Help needed to walk in hospital room?: A Little Help needed climbing 3-5 steps with a railing? : A Little 6 Click Score: 20    End of Session   Activity Tolerance: Patient tolerated treatment well Patient left: in bed;with call bell/phone within reach;with bed alarm set Nurse Communication: Mobility status;Other (comment) (found cigarettes and lighter in his bed) PT Visit Diagnosis: Unsteadiness on feet (  R26.81);Other abnormalities of gait and mobility (R26.89);Muscle weakness (generalized) (M62.81);Difficulty in walking, not elsewhere classified (R26.2);Pain Pain - Right/Left: Left Pain - part of body: Ankle and joints of foot;Leg    Time: 1610-9604 PT Time Calculation (min) (ACUTE ONLY): 18 min   Charges:   PT Evaluation $PT Eval Moderate Complexity: 1 Mod   PT General Charges $$ ACUTE PT VISIT: 1 Visit         Vernida Goodie, PT, DPT Acute Rehabilitation Services  Office: [phone]   Ellyn Hack 10/09/2023, 10:29 AM

## 2023-10-09 NOTE — Progress Notes (Signed)
 CSW spoke with patient at bedside. Patient reports he is homeless. CSW offered patient shelter resources, outpatient substance use treatment services resources,[place] resource guide resources, [facility], and transportation resources. Patient accepted all resources. Patient request for CSW to call [facility] in [facility] to see if they could accept patient. CSW called [facility] who informed CSW they do not accept his insurance it is with [facility] not [place]. Patient plans to follow up tomorrow with [facility] in [place]. All questions answered. No further questions reported at this time.

## 2023-10-09 NOTE — Discharge Summary (Signed)
 Physician Discharge Summary  Lucas Conrad ZOX:[id number] DOB: 09/30/1970 DOA: 10/08/2023  PCP: System, Provider Not In  Admit date: 10/08/2023 Discharge date: 10/09/23  Admitted From: Homeless Disposition: Home with sister Recommendations for Outpatient Follow-up:  Outpatient follow-up with PCP in 1 to 2 weeks Check CMP and CBC at follow-up Outpatient follow-up with pulmonology for lung nodule as below Please follow up on the following pending results: None  Home Health: No need identified Equipment/Devices: No need identified  Discharge Condition: Stable CODE STATUS: Full code  Follow-up Information     Lucas Flake, MD Follow up.   Specialty: Pulmonary Disease Why: We will call you with Appt date and time Contact information: [address] [place] [address] [phone]                 Hospital course 53 year old M with PMH of HTN, COPD and tobacco use disorder presenting with LLE pain for 3 to 4 days and found to have acute distal DVT near the posterior tibial vein.  CT angio chest negative for PE but showed 12 mm ill-defined RUL pulmonary nodule.  Patient was started on starter pack Eliquis and admitted for further evaluation.  The next day, patient remained stable.  TTE without significant finding.  He is discharged on starter pack Eliquis.  Prescription filled at [facility] pharmacy.  He was also issued paper prescription for refills.  Patient has been counseled on the importance of smoking cessation.   In regards to pulmonary nodules, pulmonology consulted and will arrange outpatient follow-up in 2 to 4 weeks.   Patient continued to endorse left lower leg pain and paresthesia in his left foot.  He reported 10/10 pain although he did not appear to be in distress at all.  Therapy recommended outpatient PT. Ordered MRI lumbar spine but patient did not want to wait on MRI and called his ride to go home.  Consider outpatient evaluation if left foot pain  or paresthesia persists.   See individual problem list below for more.   Problems addressed during this hospitalization Principal Problem:   Acute deep vein thrombosis (DVT) of calf muscle vein of left lower extremity (HCC) Active Problems:   Elevated troponin-demand ischemia   History of essential hypertension   Acute anemia   Pulmonary nodule-pulmonology to arrange outpatient follow-up   History of COPD   Leg DVT (deep venous thromboembolism), acute, left (HCC)   Elevated liver enzymes-check CMP at follow-up              Time spent 35 minutes  Vital signs Vitals:   10/09/23 0318 10/09/23 0355 10/09/23 0758 10/09/23 0829  BP:  (!) 149/97  (!) 147/80  Pulse:  (!) 102  85  Temp:  98.2 F (36.8 C)  98 F (36.7 C)  Resp:  20  17  Height:   5' 9 (1.753 m)   Weight: 74.6 kg     SpO2:  93%  97%  TempSrc:  Oral Oral Oral     Discharge exam  GENERAL: No apparent distress.  Nontoxic. HEENT: MMM.  Vision and hearing grossly intact.  NECK: Supple.  No apparent JVD.  RESP:  No IWOB.  Fair aeration bilaterally. CVS:  RRR. Heart sounds normal.  ABD/GI/GU: BS+. Abd soft, NTND.  MSK/EXT:  Moves extremities.  Pitting left pedal edema and paresthesia.  No tenderness.  Full range of motion in his ankles and toes.  No skin erythema. SKIN: no apparent skin lesion or wound  NEURO: Awake and alert. Oriented appropriately.  No apparent focal neuro deficit. PSYCH: Calm. Normal affect.   Discharge Instructions Discharge Instructions     Diet general   Complete by: As directed    Discharge instructions   Complete by: As directed    It has been a pleasure taking care of you!  You were hospitalized due to deep venous thrombosis (blood clot in your left leg).  We have started you on blood thinner (Eliquis).  It is very important that you take this medication as prescribed.  Avoid any over-the-counter pain medication other than plain Tylenol  while taking Eliquis.  Your CAT scan  showed lung nodule.  Please follow-up with pulmonology for further evaluation.  In regards to your leg pain and weakness, you may ask your primary care doctor for referral to orthopedic surgery outpatient.  It is important that you quit smoking cigarettes.  You may use nicotine patch to help you quit smoking.  Nicotine patch is available over-the-counter.  You may also discuss other options to help you quit smoking with your primary care doctor. You can also talk to professional counselors at 1-800-QUIT-NOW [phone]) for free smoking cessation counseling.     Take care,   Increase activity slowly   Complete by: As directed       Allergies as of 10/09/2023       Reactions   Codeine Rash        Medication List     TAKE these medications    acetaminophen  325 MG tablet Commonly known as: TYLENOL  Take 2 tablets (650 mg total) by mouth every 6 (six) hours as needed for mild pain (pain score 1-3) (or Fever >/= 101).   Apixaban Starter Pack (10mg  and 5mg ) Commonly known as: ELIQUIS STARTER PACK Take as directed on package: start with two-5mg  tablets twice daily for 7 days. On day 8, switch to one-5mg  tablet twice daily.   apixaban 5 MG Tabs tablet Commonly known as: ELIQUIS Take 1 tablet (5 mg total) by mouth 2 (two) times daily. Start when you finish the starter pack Start taking on: November 08, 2023        Consultations: Pulmonology  Procedures/Studies:   ECHOCARDIOGRAM COMPLETE Result Date: 10/09/2023    ECHOCARDIOGRAM REPORT   Patient Name:   Lucas Conrad Date of Exam: 10/09/2023 Medical Rec #:  [id number]    Height:       69.0 in Accession #:    [id number]   Weight:       164.5 lb Date of Birth:  1970/07/31    BSA:          1.901 m Patient Age:    53 years     BP:           175/101 mmHg Patient Gender: M            HR:           78 bpm. Exam Location:  Inpatient Procedure: 2D Echo, Cardiac Doppler, Color Doppler and Strain Analysis (Both            Spectral and Color  Flow Doppler were utilized during procedure). Indications:    Elevated troponin  History:        Patient has prior history of Echocardiogram examinations, most                 recent 04/08/2023. Risk Factors:Hypertension. [facility]: EF 65-70%,  normal diastology, no significant valve disease.  Sonographer:    Griselda Lederer Referring Phys: [id number] JUSTIN B HOWERTER IMPRESSIONS  1. Left ventricular ejection fraction, by estimation, is 60 to 65%. The left ventricle has normal function. The left ventricle has no regional wall motion abnormalities. Left ventricular diastolic parameters were normal. The average left ventricular global longitudinal strain is -22.1 %. The global longitudinal strain is normal.  2. Right ventricular systolic function is normal. The right ventricular size is normal. There is normal pulmonary artery systolic pressure.  3. Left atrial size was mildly dilated.  4. The mitral valve is normal in structure. Trivial mitral valve regurgitation. No evidence of mitral stenosis.  5. The aortic valve is grossly normal. There is mild calcification of the aortic valve. Aortic valve regurgitation is not visualized. No aortic stenosis is present.  6. Aortic dilatation noted. There is borderline dilatation of the ascending aorta, measuring 38 mm.  7. The inferior vena cava is normal in size with greater than 50% respiratory variability, suggesting right atrial pressure of 3 mmHg. Comparison(s): Prior images unable to be directly viewed, comparison made by report only. Conclusion(s)/Recommendation(s): Normal biventricular function without evidence of hemodynamically significant valvular heart disease. FINDINGS  Left Ventricle: Left ventricular ejection fraction, by estimation, is 60 to 65%. The left ventricle has normal function. The left ventricle has no regional wall motion abnormalities. The average left ventricular global longitudinal strain is -22.1 %. Strain was performed and the global  longitudinal strain is normal. The left ventricular internal cavity size was normal in size. There is no left ventricular hypertrophy. Left ventricular diastolic parameters were normal. Right Ventricle: The right ventricular size is normal. No increase in right ventricular wall thickness. Right ventricular systolic function is normal. There is normal pulmonary artery systolic pressure. The tricuspid regurgitant velocity is 2.40 m/s, and  with an assumed right atrial pressure of 3 mmHg, the estimated right ventricular systolic pressure is 26.0 mmHg. Left Atrium: Left atrial size was mildly dilated. Right Atrium: Right atrial size was normal in size. Pericardium: There is no evidence of pericardial effusion. Mitral Valve: The mitral valve is normal in structure. Trivial mitral valve regurgitation. No evidence of mitral valve stenosis. Tricuspid Valve: The tricuspid valve is normal in structure. Tricuspid valve regurgitation is trivial. No evidence of tricuspid stenosis. Aortic Valve: The aortic valve is grossly normal. There is mild calcification of the aortic valve. Aortic valve regurgitation is not visualized. No aortic stenosis is present. Aortic valve mean gradient measures 4.0 mmHg. Aortic valve peak gradient measures 9.1 mmHg. Aortic valve area, by VTI measures 3.54 cm. Pulmonic Valve: The pulmonic valve was not well visualized. Pulmonic valve regurgitation is not visualized. No evidence of pulmonic stenosis. Aorta: Aortic dilatation noted. There is borderline dilatation of the ascending aorta, measuring 38 mm. Venous: The inferior vena cava is normal in size with greater than 50% respiratory variability, suggesting right atrial pressure of 3 mmHg. IAS/Shunts: The atrial septum is grossly normal.  LEFT VENTRICLE PLAX 2D LVIDd:         5.40 cm   Diastology LVIDs:         3.60 cm   LV e' medial:    10.70 cm/s LV PW:         0.90 cm   LV E/e' medial:  8.1 LV IVS:        0.80 cm   LV e' lateral:   14.50 cm/s LVOT  diam:     2.20 cm  LV E/e' lateral: 5.9 LV SV:         104 LV SV Index:   55        2D Longitudinal Strain LVOT Area:     3.80 cm  2D Strain GLS (A4C):   -19.7 %                          2D Strain GLS (A3C):   -25.8 %                          2D Strain GLS (A2C):   -21.0 %                          2D Strain GLS Avg:     -22.1 %                           3D Volume EF                          LV 3D EDV:   109.45 ml RIGHT VENTRICLE             IVC RV Basal diam:  3.40 cm     IVC diam: 1.60 cm RV S prime:     14.30 cm/s TAPSE (M-mode): 2.7 cm LEFT ATRIUM             Index        RIGHT ATRIUM           Index LA diam:        3.20 cm 1.68 cm/m   RA Area:     13.25 cm LA Vol (A2C):   59.6 ml 31.35 ml/m  RA Volume:   32.40 ml  17.04 ml/m LA Vol (A4C):   43.5 ml 22.88 ml/m LA Biplane Vol: 51.6 ml 27.14 ml/m  AORTIC VALVE AV Area (Vmax):    3.32 cm AV Area (Vmean):   3.38 cm AV Area (VTI):     3.54 cm AV Vmax:           151.00 cm/s AV Vmean:          102.000 cm/s AV VTI:            0.293 m AV Peak Grad:      9.1 mmHg AV Mean Grad:      4.0 mmHg LVOT Vmax:         132.00 cm/s LVOT Vmean:        90.800 cm/s LVOT VTI:          0.273 m LVOT/AV VTI ratio: 0.93  AORTA Ao Root diam: 3.20 cm Ao Asc diam:  3.80 cm MITRAL VALVE               TRICUSPID VALVE MV Area (PHT): 3.99 cm    TR Peak grad:   23.0 mmHg MV Decel Time: 190 msec    TR Vmax:        240.00 cm/s MV E velocity: 86.20 cm/s MV A velocity: 69.60 cm/s  SHUNTS MV E/A ratio:  1.24        Systemic VTI:  0.27 m                            Systemic Diam: 2.20 cm Bridgette  Veryl Gottron MD Electronically signed by Sheryle Donning MD Signature Date/Time: 10/09/2023/4:09:48 PM    Final    DG Foot 2 Views Left Result Date: 10/09/2023 CLINICAL DATA:  [id number] Localized swelling of left foot [id number] EXAM: LEFT FOOT - 2 VIEW COMPARISON:  09/28/2023 FINDINGS: There is no evidence of fracture or dislocation. There is no evidence of arthropathy or other focal bone  abnormality. Soft tissues are unremarkable. IMPRESSION: Negative. Electronically Signed   By: Nicoletta Barrier M.D.   On: 10/09/2023 10:14   CT Angio Chest PE W and/or Wo Contrast Result Date: 10/08/2023 CLINICAL DATA:  Possible syncopal episode. EXAM: CT ANGIOGRAPHY CHEST WITH CONTRAST TECHNIQUE: Multidetector CT imaging of the chest was performed using the standard protocol during bolus administration of intravenous contrast. Multiplanar CT image reconstructions and MIPs were obtained to evaluate the vascular anatomy. RADIATION DOSE REDUCTION: This exam was performed according to the departmental dose-optimization program which includes automated exposure control, adjustment of the mA and/or kV according to patient size and/or use of iterative reconstruction technique. CONTRAST:  75mL OMNIPAQUE  IOHEXOL  350 MG/ML SOLN COMPARISON:  December 10, 2022 FINDINGS: Cardiovascular: The ascending thoracic aorta measures approximately 3.9 cm in diameter. Satisfactory opacification of the pulmonary arteries to the segmental level. No evidence of pulmonary embolism. Normal heart size. No pericardial effusion. Mediastinum/Nodes: There is mild AP window and mild right hilar lymphadenopathy. Thyroid gland, trachea, and esophagus demonstrate no significant findings. Lungs/Pleura: There is mild to moderate severity emphysematous lung disease involving the bilateral upper lobes. A 12 mm ill-defined pulmonary nodule is seen within the lateral aspect of the right upper lobe (axial CT image 54, CT series 13). This measured 9 mm on the prior study (axial CT image 60, CT series 6) and is more prominent in appearance on the current exam. Mild lateral right middle lobe linear scarring and/or atelectasis is seen. Mild to moderate severity posterior right basilar atelectasis and/or infiltrate is also noted. No pleural effusion or pneumothorax is identified. Upper Abdomen: Multiple surgical clips are seen within the gallbladder fossa.  Musculoskeletal: No chest wall abnormality. No acute or significant osseous findings. Review of the MIP images confirms the above findings. IMPRESSION: 1. No evidence of pulmonary embolism. 2. Mild to moderate severity posterior right basilar atelectasis and/or infiltrate. 3. 12 mm ill-defined right upper lobe pulmonary nodule, as described above. Follow-up PET-CT or tissue sampling is recommended to further evaluate for the presence of an underlying neoplastic process. This recommendation follows the consensus statement: Guidelines for Management of Incidental Pulmonary Nodules Detected on CT Images: From the Fleischner Society 2017; Radiology 2017; 284:228-243. 4. Mild to moderate severity emphysematous lung disease. 5. Evidence of prior cholecystectomy. Electronically Signed   By: Virgle Grime M.D.   On: 10/08/2023 13:14   US  Venous Img Lower  Left (DVT Study) Result Date: 10/08/2023 CLINICAL DATA:  Left lower extremity pain and swelling. EXAM: Left LOWER EXTREMITY VENOUS DOPPLER ULTRASOUND TECHNIQUE: Gray-scale sonography with graded compression, as well as color Doppler and duplex ultrasound were performed to evaluate the lower extremity deep venous systems from the level of the common femoral vein and including the common femoral, femoral, profunda femoral, popliteal and calf veins including the posterior tibial, peroneal and gastrocnemius veins when visible. The superficial great saphenous vein was also interrogated. Spectral Doppler was utilized to evaluate flow at rest and with distal augmentation maneuvers in the common femoral, femoral and popliteal veins. COMPARISON:  None Available. FINDINGS: Contralateral Common Femoral Vein: Respiratory phasicity is normal and symmetric with  the symptomatic side. No evidence of thrombus. Normal compressibility. Common Femoral Vein: No evidence of thrombus. Normal compressibility, respiratory phasicity and response to augmentation. Saphenofemoral Junction: No  evidence of thrombus. Normal compressibility and flow on color Doppler imaging. Profunda Femoral Vein: No evidence of thrombus. Normal compressibility and flow on color Doppler imaging. Femoral Vein: No evidence of thrombus. Normal compressibility, respiratory phasicity and response to augmentation. Popliteal Vein: No evidence of thrombus. Normal compressibility, respiratory phasicity and response to augmentation. Calf Veins: There is noncompressible vessels in the calf near the posterior tibial vein. Superficial Great Saphenous Vein: No evidence of thrombus. Normal compressibility. Venous Reflux:  None. Other Findings:  None. IMPRESSION: Calf vein thrombus near the posterior tibial vein. No above the knee DVT identified in the left lower extremity. Electronically Signed   By: Adrianna Horde M.D.   On: 10/08/2023 11:52   DG Chest 2 View Result Date: 10/08/2023 CLINICAL DATA:  Nodule in the right back EXAM: CHEST - 2 VIEW COMPARISON:  December 10, 2022 FINDINGS: The heart size and mediastinal contours are within normal limits. Both lungs are clear. The visualized skeletal structures are unremarkable. No change in the right lower lobe density against the diaphragm which probably represent diaphragmatic adhesions, pleural diaphragmatic adhesions. IMPRESSION: No acute cardiopulmonary disease.  COPD changes Electronically Signed   By: Fredrich Jefferson M.D.   On: 10/08/2023 10:54       The results of significant diagnostics from this hospitalization (including imaging, microbiology, ancillary and laboratory) are listed below for reference.     Microbiology: No results found for this or any previous visit (from the past 240 hours).   Labs:  CBC: Recent Labs  Lab 10/08/23 0921 10/09/23 0450  WBC 10.8* 9.9  NEUTROABS 7.0 6.1  HGB 11.2* 11.7*  HCT 35.0* 35.9*  MCV 87.5 85.9  PLT 357 404*   BMP &GFR Recent Labs  Lab 10/08/23 0921 10/09/23 0450  NA 141 139  K 3.9 4.0  CL 102 103  CO2 34* 27   GLUCOSE 110* 108*  BUN 18 12  CREATININE 1.06 0.90  CALCIUM 8.9 8.9  MG  --  1.5*   Estimated Creatinine Clearance: 94.9 mL/min (by C-G formula based on SCr of 0.9 mg/dL). Liver & Pancreas: Recent Labs  Lab 10/08/23 0921 10/09/23 0450  AST 40 54*  ALT 72* 83*  ALKPHOS 80 76  BILITOT 0.4 0.5  PROT 6.1* 5.5*  ALBUMIN 3.8 3.0*   No results for input(s): LIPASE, AMYLASE in the last 168 hours. No results for input(s): AMMONIA in the last 168 hours. Diabetic: No results for input(s): HGBA1C in the last 72 hours. No results for input(s): GLUCAP in the last 168 hours. Cardiac Enzymes: No results for input(s): CKTOTAL, CKMB, CKMBINDEX, TROPONINI in the last 168 hours. No results for input(s): PROBNP in the last 8760 hours. Coagulation Profile: Recent Labs  Lab 10/09/23 0450  INR 1.1   Thyroid Function Tests: No results for input(s): TSH, T4TOTAL, FREET4, T3FREE, THYROIDAB in the last 72 hours. Lipid Profile: No results for input(s): CHOL, HDL, LDLCALC, TRIG, CHOLHDL, LDLDIRECT in the last 72 hours. Anemia Panel: Recent Labs    10/09/23 0450  VITAMINB12 579  FOLATE 9.9  FERRITIN 86  TIBC 403  IRON 55  RETICCTPCT 2.1   Urine analysis:    Component Value Date/Time   COLORURINE STRAW (A) 10/09/2023 0452   APPEARANCEUR CLEAR 10/09/2023 0452   LABSPEC 1.006 10/09/2023 0452   PHURINE 9.0 (H) 10/09/2023 1610  GLUCOSEU NEGATIVE 10/09/2023 0452   HGBUR NEGATIVE 10/09/2023 0452   BILIRUBINUR NEGATIVE 10/09/2023 0452   KETONESUR NEGATIVE 10/09/2023 0452   PROTEINUR NEGATIVE 10/09/2023 0452   UROBILINOGEN 1.0 02/06/2013 1450   NITRITE NEGATIVE 10/09/2023 0452   LEUKOCYTESUR NEGATIVE 10/09/2023 0452   Sepsis Labs: Invalid input(s): PROCALCITONIN, LACTICIDVEN   SIGNED:  Iyan Flett T Dante Cooter, MD  [facility] 10/09/2023, 5:18 PM

## 2023-10-09 NOTE — Telephone Encounter (Signed)
 Called PT and got male who said he does not live there. He is in hospital. Please CB next week, she said.

## 2023-10-09 NOTE — Plan of Care (Signed)
  Problem: Education: Goal: Knowledge of General Education information will improve Description: Including pain rating scale, medication(s)/side effects and non-pharmacologic comfort measures 10/09/2023 1351 by Sunnie England, RN Outcome: Adequate for Discharge 10/09/2023 1351 by Sunnie England, RN Outcome: Progressing   Problem: Health Behavior/Discharge Planning: Goal: Ability to manage health-related needs will improve 10/09/2023 1351 by Sunnie England, RN Outcome: Adequate for Discharge 10/09/2023 1351 by Sunnie England, RN Outcome: Progressing   Problem: Clinical Measurements: Goal: Ability to maintain clinical measurements within normal limits will improve 10/09/2023 1351 by Sunnie England, RN Outcome: Adequate for Discharge 10/09/2023 1351 by Sunnie England, RN Outcome: Progressing Goal: Will remain free from infection 10/09/2023 1351 by Sunnie England, RN Outcome: Adequate for Discharge 10/09/2023 1351 by Sunnie England, RN Outcome: Progressing Goal: Diagnostic test results will improve 10/09/2023 1351 by Sunnie England, RN Outcome: Adequate for Discharge 10/09/2023 1351 by Sunnie England, RN Outcome: Progressing Goal: Respiratory complications will improve 10/09/2023 1351 by Sunnie England, RN Outcome: Adequate for Discharge 10/09/2023 1351 by Sunnie England, RN Outcome: Progressing Goal: Cardiovascular complication will be avoided 10/09/2023 1351 by Sunnie England, RN Outcome: Adequate for Discharge 10/09/2023 1351 by Sunnie England, RN Outcome: Progressing   Problem: Activity: Goal: Risk for activity intolerance will decrease 10/09/2023 1351 by Sunnie England, RN Outcome: Adequate for Discharge 10/09/2023 1351 by Sunnie England, RN Outcome: Progressing   Problem: Nutrition: Goal: Adequate nutrition will be maintained 10/09/2023 1351 by Sunnie England, RN Outcome: Adequate for Discharge 10/09/2023 1351 by  Sunnie England, RN Outcome: Progressing   Problem: Coping: Goal: Level of anxiety will decrease 10/09/2023 1351 by Sunnie England, RN Outcome: Adequate for Discharge 10/09/2023 1351 by Sunnie England, RN Outcome: Progressing   Problem: Elimination: Goal: Will not experience complications related to bowel motility 10/09/2023 1351 by Sunnie England, RN Outcome: Adequate for Discharge 10/09/2023 1351 by Sunnie England, RN Outcome: Progressing Goal: Will not experience complications related to urinary retention 10/09/2023 1351 by Sunnie England, RN Outcome: Adequate for Discharge 10/09/2023 1351 by Sunnie England, RN Outcome: Progressing   Problem: Pain Managment: Goal: General experience of comfort will improve and/or be controlled 10/09/2023 1351 by Sunnie England, RN Outcome: Adequate for Discharge 10/09/2023 1351 by Sunnie England, RN Outcome: Progressing   Problem: Safety: Goal: Ability to remain free from injury will improve 10/09/2023 1351 by Sunnie England, RN Outcome: Adequate for Discharge 10/09/2023 1351 by Sunnie England, RN Outcome: Progressing   Problem: Skin Integrity: Goal: Risk for impaired skin integrity will decrease 10/09/2023 1351 by Sunnie England, RN Outcome: Adequate for Discharge 10/09/2023 1351 by Sunnie England, RN Outcome: Progressing

## 2023-10-10 ENCOUNTER — Other Ambulatory Visit (HOSPITAL_COMMUNITY): Payer: Self-pay

## 2023-10-10 ENCOUNTER — Emergency Department (HOSPITAL_COMMUNITY)
Admission: EM | Admit: 2023-10-10 | Discharge: 2023-10-11 | Attending: Emergency Medicine | Admitting: Emergency Medicine

## 2023-10-10 DIAGNOSIS — M79662 Pain in left lower leg: Secondary | ICD-10-CM | POA: Diagnosis present

## 2023-10-10 DIAGNOSIS — Z7901 Long term (current) use of anticoagulants: Secondary | ICD-10-CM | POA: Diagnosis not present

## 2023-10-10 DIAGNOSIS — M79605 Pain in left leg: Secondary | ICD-10-CM

## 2023-10-10 MED ORDER — HYDROMORPHONE HCL 1 MG/ML IJ SOLN
1.0000 mg | Freq: Once | INTRAMUSCULAR | Status: AC
  Administered 2023-10-10: 1 mg via INTRAVENOUS
  Filled 2023-10-10: qty 1

## 2023-10-10 MED ORDER — OXYCODONE-ACETAMINOPHEN 5-325 MG PO TABS: 1.0000 | ORAL_TABLET | Freq: Four times a day (QID) | ORAL | 0 refills | Status: AC | PRN

## 2023-10-10 NOTE — ED Provider Notes (Signed)
 Lucas Conrad   CSN: [id number] Arrival date & time: 10/10/23  2043     Patient presents with: No chief complaint on file.   Lucas Conrad is a 53 y.o. male presented to ED with left calf pain.  Patient was hospitalized recently for acute DVT found on ultrasound localized near the left calf.  He was admitted to the hospital with a negative PE study.  He was having difficulty with pain control in the hospital.  He says he was discharged without pain medicine and returns today from [facility] with intractable pain behind his left calf down into his foot.   HPI     Prior to Admission medications   Medication Sig Start Date End Date Taking? Authorizing Provider  acetaminophen  (TYLENOL ) 325 MG tablet Take 2 tablets (650 mg total) by mouth every 6 (six) hours as needed for mild pain (pain score 1-3) (or Fever >/= 101). 10/09/23  Yes Gonfa, Taye T, MD  APIXABAN  (ELIQUIS ) VTE STARTER PACK (10MG  AND 5MG ) Take as directed on package: start with two-5mg  tablets twice daily for 7 days. On day 8, switch to one-5mg  tablet twice daily. 10/09/23  Yes Gonfa, Taye T, MD  naloxone  (NARCAN ) nasal spray 4 mg/0.1 mL Place 1 spray into the nose once.   Yes [provider]  oxyCODONE-acetaminophen  (PERCOCET/ROXICET) 5-325 MG tablet Take 1 tablet by mouth every 6 (six) hours as needed for up to 15 doses for severe pain (pain score 7-10). 10/10/23  Yes Niranjan Rufener, Janalyn Me, MD  apixaban  (ELIQUIS ) 5 MG TABS tablet Take 1 tablet (5 mg total) by mouth 2 (two) times daily. Start when you finish the starter pack Patient not taking: Reported on 10/10/2023 11/08/23   Gonfa, Taye T, MD    Allergies: Codeine    Review of Systems  Updated Vital Signs BP (!) 176/108   Pulse 80   Temp 97.7 F (36.5 C) (Oral)   Resp 15   SpO2 97%   Physical Exam HENT:     Head: Normocephalic and atraumatic.   Eyes:     Conjunctiva/sclera: Conjunctivae normal.     Pupils:  Pupils are equal, round, and reactive to light.    Cardiovascular:     Rate and Rhythm: Normal rate and regular rhythm.     Pulses: Normal pulses.  Pulmonary:     Effort: Pulmonary effort is normal. No respiratory distress.  Abdominal:     General: There is no distension.     Tenderness: There is no abdominal tenderness.   Musculoskeletal:     Comments: No visible swelling of the left calf, no overlying erythema, distal foot is warm and well-perfused, brisk cap refill of the toenails   Skin:    General: Skin is warm and dry.   Neurological:     General: No focal deficit present.     Mental Status: He is alert and oriented to person, place, and time. Mental status is at baseline.   Psychiatric:        Mood and Affect: Mood normal.        Behavior: Behavior normal.     (all labs ordered are listed, but only abnormal results are displayed) Labs Reviewed - No data to display  EKG: None  Radiology: ECHOCARDIOGRAM COMPLETE Result Date: 10/09/2023    ECHOCARDIOGRAM REPORT   Patient Name:   Lucas Conrad Date of Exam: 10/09/2023 Medical Rec #:  [id number]    Height:  69.0 in Accession #:    [id number]   Weight:       164.5 lb Date of Birth:  February 05, 1971    BSA:          1.901 m Patient Age:    52 years     BP:           175/101 mmHg Patient Gender: M            HR:           78 bpm. Exam Location:  Inpatient Procedure: 2D Echo, Cardiac Doppler, Color Doppler and Strain Analysis (Both            Spectral and Color Flow Doppler were utilized during procedure). Indications:    Elevated troponin  History:        Patient has prior history of Echocardiogram examinations, most                 recent 04/08/2023. Risk Factors:Hypertension. [facility]: EF 65-70%,                 normal diastology, no significant valve disease.  Sonographer:    Griselda Lederer Referring Phys: [id number] JUSTIN B HOWERTER IMPRESSIONS  1. Left ventricular ejection fraction, by estimation, is 60 to 65%. The left ventricle has  normal function. The left ventricle has no regional wall motion abnormalities. Left ventricular diastolic parameters were normal. The average left ventricular global longitudinal strain is -22.1 %. The global longitudinal strain is normal.  2. Right ventricular systolic function is normal. The right ventricular size is normal. There is normal pulmonary artery systolic pressure.  3. Left atrial size was mildly dilated.  4. The mitral valve is normal in structure. Trivial mitral valve regurgitation. No evidence of mitral stenosis.  5. The aortic valve is grossly normal. There is mild calcification of the aortic valve. Aortic valve regurgitation is not visualized. No aortic stenosis is present.  6. Aortic dilatation noted. There is borderline dilatation of the ascending aorta, measuring 38 mm.  7. The inferior vena cava is normal in size with greater than 50% respiratory variability, suggesting right atrial pressure of 3 mmHg. Comparison(s): Prior images unable to be directly viewed, comparison made by report only. Conclusion(s)/Recommendation(s): Normal biventricular function without evidence of hemodynamically significant valvular heart disease. FINDINGS  Left Ventricle: Left ventricular ejection fraction, by estimation, is 60 to 65%. The left ventricle has normal function. The left ventricle has no regional wall motion abnormalities. The average left ventricular global longitudinal strain is -22.1 %. Strain was performed and the global longitudinal strain is normal. The left ventricular internal cavity size was normal in size. There is no left ventricular hypertrophy. Left ventricular diastolic parameters were normal. Right Ventricle: The right ventricular size is normal. No increase in right ventricular wall thickness. Right ventricular systolic function is normal. There is normal pulmonary artery systolic pressure. The tricuspid regurgitant velocity is 2.40 m/s, and  with an assumed right atrial pressure of 3  mmHg, the estimated right ventricular systolic pressure is 26.0 mmHg. Left Atrium: Left atrial size was mildly dilated. Right Atrium: Right atrial size was normal in size. Pericardium: There is no evidence of pericardial effusion. Mitral Valve: The mitral valve is normal in structure. Trivial mitral valve regurgitation. No evidence of mitral valve stenosis. Tricuspid Valve: The tricuspid valve is normal in structure. Tricuspid valve regurgitation is trivial. No evidence of tricuspid stenosis. Aortic Valve: The aortic valve is grossly normal. There is mild calcification of the aortic  valve. Aortic valve regurgitation is not visualized. No aortic stenosis is present. Aortic valve mean gradient measures 4.0 mmHg. Aortic valve peak gradient measures 9.1 mmHg. Aortic valve area, by VTI measures 3.54 cm. Pulmonic Valve: The pulmonic valve was not well visualized. Pulmonic valve regurgitation is not visualized. No evidence of pulmonic stenosis. Aorta: Aortic dilatation noted. There is borderline dilatation of the ascending aorta, measuring 38 mm. Venous: The inferior vena cava is normal in size with greater than 50% respiratory variability, suggesting right atrial pressure of 3 mmHg. IAS/Shunts: The atrial septum is grossly normal.  LEFT VENTRICLE PLAX 2D LVIDd:         5.40 cm   Diastology LVIDs:         3.60 cm   LV e' medial:    10.70 cm/s LV PW:         0.90 cm   LV E/e' medial:  8.1 LV IVS:        0.80 cm   LV e' lateral:   14.50 cm/s LVOT diam:     2.20 cm   LV E/e' lateral: 5.9 LV SV:         104 LV SV Index:   55        2D Longitudinal Strain LVOT Area:     3.80 cm  2D Strain GLS (A4C):   -19.7 %                          2D Strain GLS (A3C):   -25.8 %                          2D Strain GLS (A2C):   -21.0 %                          2D Strain GLS Avg:     -22.1 %                           3D Volume EF                          LV 3D EDV:   109.45 ml RIGHT VENTRICLE             IVC RV Basal diam:  3.40 cm     IVC  diam: 1.60 cm RV S prime:     14.30 cm/s TAPSE (M-mode): 2.7 cm LEFT ATRIUM             Index        RIGHT ATRIUM           Index LA diam:        3.20 cm 1.68 cm/m   RA Area:     13.25 cm LA Vol (A2C):   59.6 ml 31.35 ml/m  RA Volume:   32.40 ml  17.04 ml/m LA Vol (A4C):   43.5 ml 22.88 ml/m LA Biplane Vol: 51.6 ml 27.14 ml/m  AORTIC VALVE AV Area (Vmax):    3.32 cm AV Area (Vmean):   3.38 cm AV Area (VTI):     3.54 cm AV Vmax:           151.00 cm/s AV Vmean:          102.000 cm/s AV VTI:  0.293 m AV Peak Grad:      9.1 mmHg AV Mean Grad:      4.0 mmHg LVOT Vmax:         132.00 cm/s LVOT Vmean:        90.800 cm/s LVOT VTI:          0.273 m LVOT/AV VTI ratio: 0.93  AORTA Ao Root diam: 3.20 cm Ao Asc diam:  3.80 cm MITRAL VALVE               TRICUSPID VALVE MV Area (PHT): 3.99 cm    TR Peak grad:   23.0 mmHg MV Decel Time: 190 msec    TR Vmax:        240.00 cm/s MV E velocity: 86.20 cm/s MV A velocity: 69.60 cm/s  SHUNTS MV E/A ratio:  1.24        Systemic VTI:  0.27 m                            Systemic Diam: 2.20 cm Sheryle Donning MD Electronically signed by Sheryle Donning MD Signature Date/Time: 10/09/2023/4:09:48 PM    Final    DG Foot 2 Views Left Result Date: 10/09/2023 CLINICAL DATA:  [id number] Localized swelling of left foot [id number] EXAM: LEFT FOOT - 2 VIEW COMPARISON:  09/28/2023 FINDINGS: There is no evidence of fracture or dislocation. There is no evidence of arthropathy or other focal bone abnormality. Soft tissues are unremarkable. IMPRESSION: Negative. Electronically Signed   By: Nicoletta Barrier M.D.   On: 10/09/2023 10:14     Procedures   Medications Ordered in the ED  HYDROmorphone  (DILAUDID ) injection 1 mg (1 mg Intravenous Given 10/10/23 2122)  HYDROmorphone  (DILAUDID ) injection 1 mg (1 mg Intravenous Given 10/10/23 2255)                                    Medical Decision Making Risk Prescription drug management.   Patient is here complaining of leg pain  with a known DVT I corresponds the localization of his pain.  He does not have evidence of cerulea dolens, including discoloration of his leg or loss of pulses or poor cap refill.  He is clearly uncomfortable on exam and needing pain medicine.  I will give a dose of IV Dilaudid  and consider short course of opioids.  He is compliant with his Eliquis  which is present at the bedside, but not a candidate for NSAID medications as a result.  Patient tells me he is NOT in drug rehab at this time - reportedly he was brought here from [facility] today by [facility]  Doubt acute PE.  No further workup indicated acutely     Final diagnoses:  Pain of left lower extremity    ED Discharge Orders          Ordered    oxyCODONE-acetaminophen  (PERCOCET/ROXICET) 5-325 MG tablet  Every 6 hours PRN        10/10/23 2307               Arvilla Birmingham, MD 10/10/23 2329

## 2023-10-10 NOTE — ED Triage Notes (Addendum)
 BIB By Lyna Sandhoff from [facility] for DVT in left lower leg, numbness to foot Started Eliquis  today but no relief. D/c Yesterday.  Hx of rec fentanyl  use 158/96 HR 86 96% RA 22 RR

## 2023-10-10 NOTE — ED Notes (Signed)
 [facility] called for transport.

## 2023-10-10 NOTE — Discharge Instructions (Signed)
 Please make an appointment of the vascular vein specialist for your blood clot and leg pain.

## 2023-10-11 MED ORDER — ACETAMINOPHEN 325 MG PO TABS
650.0000 mg | ORAL_TABLET | Freq: Once | ORAL | Status: AC
  Administered 2023-10-11: 650 mg via ORAL
  Filled 2023-10-11: qty 2

## 2023-10-11 MED ORDER — OXYCODONE-ACETAMINOPHEN 5-325 MG PO TABS
1.0000 | ORAL_TABLET | Freq: Once | ORAL | Status: AC
  Administered 2023-10-11: 1 via ORAL
  Filled 2023-10-11: qty 1

## 2023-10-11 NOTE — ED Notes (Addendum)
 Pt ambulated to lobby using walker, pt stated he just wants to leave. Pt left prior to social work consult for housing insecurity. Pt refused vs, AVS reviewed earlier this morning, A&Ox4. EDP updated.

## 2023-10-11 NOTE — ED Notes (Signed)
 Pt told tech in lobby he changed his mind and would prefer to stay and have social work consult. Pt brought back to room

## 2023-10-11 NOTE — ED Notes (Signed)
 Offered pt to remain in ED room overnight and have social work consult, pt declines and states he just wants to have his sister pick him up. Pt opts to leave with sister from ED at d/c. Pt is A&Ox4.

## 2023-10-14 ENCOUNTER — Encounter: Payer: Self-pay | Admitting: Emergency Medicine

## 2023-10-14 NOTE — Telephone Encounter (Signed)
 Atc x1. Left message to call back and schedule appt.  Routing to front staff to schedule patient.   JD has HFU slot 6/27 & RB has nodule slot 6/19. Please schedule patient.

## 2023-10-14 NOTE — Telephone Encounter (Signed)
 Sent ltr to PT since he does not live at the phone number on file.

## 2023-10-14 NOTE — Telephone Encounter (Signed)
 Need a nodule slot w RB or APP. If none available then would be ok for him to see any provider as a new consult / hosp follow up

## 2023-10-29 NOTE — Telephone Encounter (Signed)
 Ret pt call and left VM for PT to call and make appt w/Ms. Groce for nod.

## 2023-11-17 NOTE — Telephone Encounter (Signed)
 No MYCHART. Have already sent a letter and PT does not live at number provided. Please advise what to do from here.

## 2023-11-19 NOTE — Telephone Encounter (Signed)
 I have tried calling the patient x2. There was no answer so I left a message to call back.  A letter attempt has been sent to patient to schedule an appointment. No Mychart. Per previous note pt does not live at address provided in chart.  I am closing the encounter as we have no way to reach the patient.

## 2023-12-22 ENCOUNTER — Ambulatory Visit: Payer: Self-pay | Admitting: Nurse Practitioner
